# Patient Record
Sex: Female | Born: 1965 | Race: White | Hispanic: No | Smoking: Current every day smoker
Health system: Southern US, Community
[De-identification: ages and names within clinical notes are randomized; demographics above are authoritative.]

## PROBLEM LIST (undated history)

## (undated) DIAGNOSIS — Z860100 Personal history of colon polyps, unspecified: Secondary | ICD-10-CM

## (undated) DIAGNOSIS — Z8371 Family history of colonic polyps: Secondary | ICD-10-CM

## (undated) DIAGNOSIS — Z8489 Family history of other specified conditions: Secondary | ICD-10-CM

## (undated) DIAGNOSIS — G4730 Sleep apnea, unspecified: Secondary | ICD-10-CM

## (undated) DIAGNOSIS — G2581 Restless legs syndrome: Secondary | ICD-10-CM

## (undated) DIAGNOSIS — R112 Nausea with vomiting, unspecified: Secondary | ICD-10-CM

## (undated) DIAGNOSIS — K219 Gastro-esophageal reflux disease without esophagitis: Secondary | ICD-10-CM

## (undated) DIAGNOSIS — M1990 Unspecified osteoarthritis, unspecified site: Secondary | ICD-10-CM

## (undated) DIAGNOSIS — Z9889 Other specified postprocedural states: Secondary | ICD-10-CM

## (undated) DIAGNOSIS — I10 Essential (primary) hypertension: Secondary | ICD-10-CM

## (undated) DIAGNOSIS — Z803 Family history of malignant neoplasm of breast: Secondary | ICD-10-CM

## (undated) DIAGNOSIS — Z85528 Personal history of other malignant neoplasm of kidney: Secondary | ICD-10-CM

## (undated) DIAGNOSIS — M109 Gout, unspecified: Secondary | ICD-10-CM

## (undated) DIAGNOSIS — E119 Type 2 diabetes mellitus without complications: Secondary | ICD-10-CM

## (undated) DIAGNOSIS — Q859 Phakomatosis, unspecified: Secondary | ICD-10-CM

## (undated) DIAGNOSIS — C649 Malignant neoplasm of unspecified kidney, except renal pelvis: Secondary | ICD-10-CM

## (undated) DIAGNOSIS — Z801 Family history of malignant neoplasm of trachea, bronchus and lung: Secondary | ICD-10-CM

## (undated) DIAGNOSIS — Z800 Family history of malignant neoplasm of digestive organs: Secondary | ICD-10-CM

## (undated) DIAGNOSIS — M5136 Other intervertebral disc degeneration, lumbar region: Secondary | ICD-10-CM

## (undated) DIAGNOSIS — Z83719 Family history of colon polyps, unspecified: Secondary | ICD-10-CM

## (undated) DIAGNOSIS — M51369 Other intervertebral disc degeneration, lumbar region without mention of lumbar back pain or lower extremity pain: Secondary | ICD-10-CM

## (undated) DIAGNOSIS — E785 Hyperlipidemia, unspecified: Secondary | ICD-10-CM

## (undated) HISTORY — DX: Gout, unspecified: M10.9

## (undated) HISTORY — DX: Personal history of colonic polyps: Z86.010

## (undated) HISTORY — DX: Unspecified osteoarthritis, unspecified site: M19.90

## (undated) HISTORY — DX: Gastro-esophageal reflux disease without esophagitis: K21.9

## (undated) HISTORY — DX: Family history of malignant neoplasm of digestive organs: Z80.0

## (undated) HISTORY — DX: Personal history of colon polyps, unspecified: Z86.0100

## (undated) HISTORY — PX: TUBAL LIGATION: SHX77

## (undated) HISTORY — PX: ABDOMINAL HYSTERECTOMY: SHX81

## (undated) HISTORY — DX: Family history of malignant neoplasm of breast: Z80.3

## (undated) HISTORY — DX: Family history of colonic polyps: Z83.71

## (undated) HISTORY — DX: Family history of malignant neoplasm of trachea, bronchus and lung: Z80.1

## (undated) HISTORY — PX: CHOLECYSTECTOMY: SHX55

## (undated) HISTORY — PX: OTHER SURGICAL HISTORY: SHX169

## (undated) HISTORY — DX: Sleep apnea, unspecified: G47.30

## (undated) HISTORY — DX: Phakomatosis, unspecified: Q85.9

## (undated) HISTORY — DX: Personal history of other malignant neoplasm of kidney: Z85.528

## (undated) HISTORY — PX: TONSILLECTOMY: SUR1361

## (undated) HISTORY — DX: Family history of colon polyps, unspecified: Z83.719

---

## 2004-11-17 ENCOUNTER — Emergency Department: Payer: Self-pay | Admitting: Emergency Medicine

## 2005-05-05 ENCOUNTER — Ambulatory Visit: Payer: Self-pay | Admitting: Obstetrics and Gynecology

## 2005-08-14 ENCOUNTER — Ambulatory Visit: Payer: Self-pay | Admitting: Obstetrics and Gynecology

## 2005-09-22 ENCOUNTER — Ambulatory Visit: Payer: Self-pay | Admitting: Internal Medicine

## 2006-06-05 DIAGNOSIS — C649 Malignant neoplasm of unspecified kidney, except renal pelvis: Secondary | ICD-10-CM

## 2006-06-05 HISTORY — DX: Malignant neoplasm of unspecified kidney, except renal pelvis: C64.9

## 2006-06-05 HISTORY — PX: ROBOTIC ASSITED PARTIAL NEPHRECTOMY: SHX6087

## 2006-08-28 ENCOUNTER — Ambulatory Visit: Payer: Self-pay | Admitting: Obstetrics and Gynecology

## 2006-08-29 ENCOUNTER — Ambulatory Visit: Payer: Self-pay | Admitting: Obstetrics and Gynecology

## 2006-08-29 ENCOUNTER — Other Ambulatory Visit: Payer: Self-pay

## 2006-09-03 ENCOUNTER — Ambulatory Visit: Payer: Self-pay | Admitting: Obstetrics and Gynecology

## 2007-04-01 ENCOUNTER — Ambulatory Visit: Payer: Self-pay | Admitting: Internal Medicine

## 2007-04-05 ENCOUNTER — Ambulatory Visit: Payer: Self-pay | Admitting: Internal Medicine

## 2007-04-06 ENCOUNTER — Ambulatory Visit: Payer: Self-pay | Admitting: Internal Medicine

## 2007-04-12 ENCOUNTER — Ambulatory Visit: Payer: Self-pay | Admitting: Internal Medicine

## 2007-05-06 ENCOUNTER — Ambulatory Visit: Payer: Self-pay | Admitting: Internal Medicine

## 2007-07-07 ENCOUNTER — Ambulatory Visit: Payer: Self-pay | Admitting: Internal Medicine

## 2007-08-04 ENCOUNTER — Ambulatory Visit: Payer: Self-pay | Admitting: Internal Medicine

## 2007-08-30 ENCOUNTER — Ambulatory Visit: Payer: Self-pay | Admitting: Obstetrics and Gynecology

## 2007-09-04 ENCOUNTER — Ambulatory Visit: Payer: Self-pay | Admitting: Internal Medicine

## 2007-12-04 ENCOUNTER — Ambulatory Visit: Payer: Self-pay | Admitting: Internal Medicine

## 2007-12-20 ENCOUNTER — Ambulatory Visit: Payer: Self-pay | Admitting: Internal Medicine

## 2008-01-04 ENCOUNTER — Ambulatory Visit: Payer: Self-pay | Admitting: Internal Medicine

## 2008-02-04 ENCOUNTER — Ambulatory Visit: Payer: Self-pay | Admitting: Internal Medicine

## 2008-03-05 ENCOUNTER — Ambulatory Visit: Payer: Self-pay | Admitting: Internal Medicine

## 2008-03-30 IMAGING — CT CT ABDOMEN W/O CM
1 of 2 series · 15 of 32 positions shown, 20 images · non-contrast
Comparison: none

REASON FOR EXAM: renal cancer renal insufficiency
COMMENTS:

PROCEDURE:     CT  - CT ABDOMEN STANDARD WO  - July 31, 2007  [DATE]
RESULT:
HISTORY: Renal insufficiency, renal cancer.

[Series 2: abdomen · axial · 0.69mm/px · z∈[-818,-554]mm · 15 of 37 slices shown, 20 images]
[im 2/37  soft-tissue]
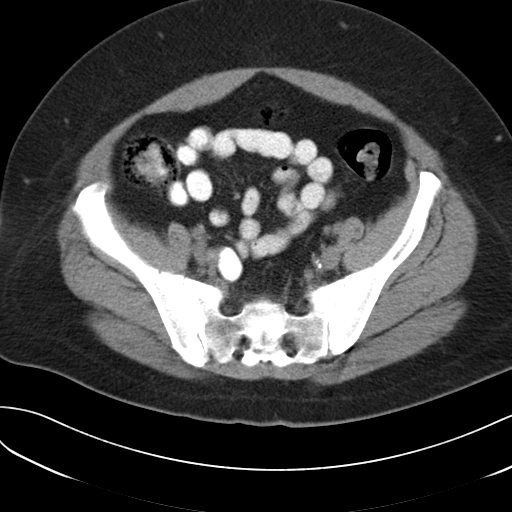
[im 2/37  bone]
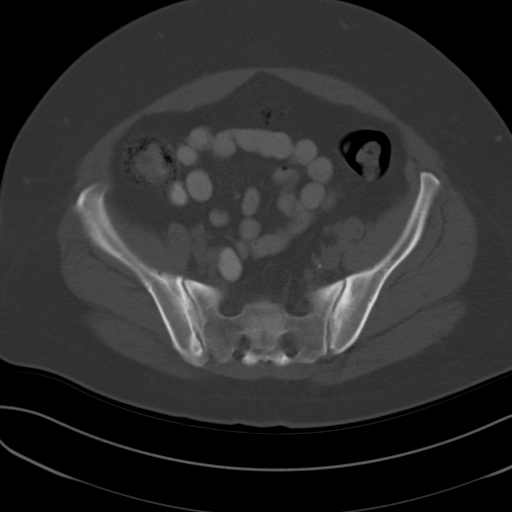
[im 5/37  soft-tissue]
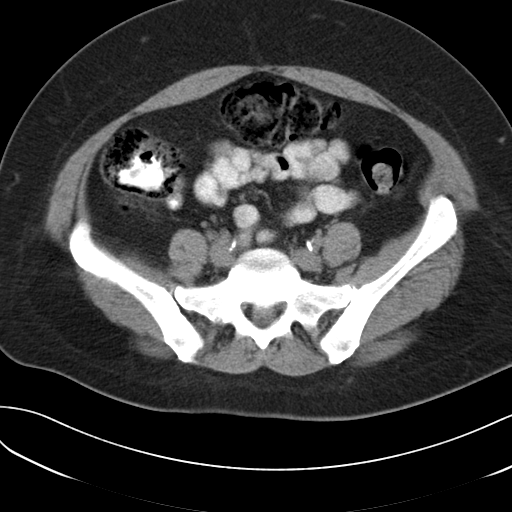
[im 7/37  soft-tissue]
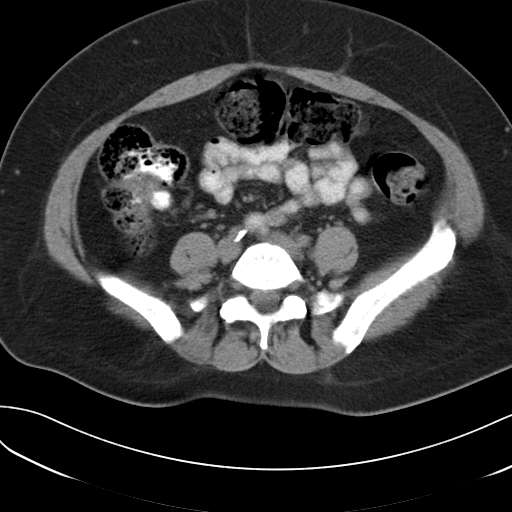
[im 10/37  soft-tissue]
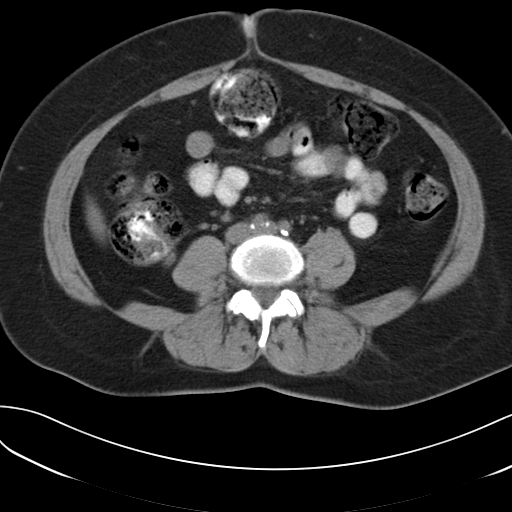
[im 12/37  soft-tissue]
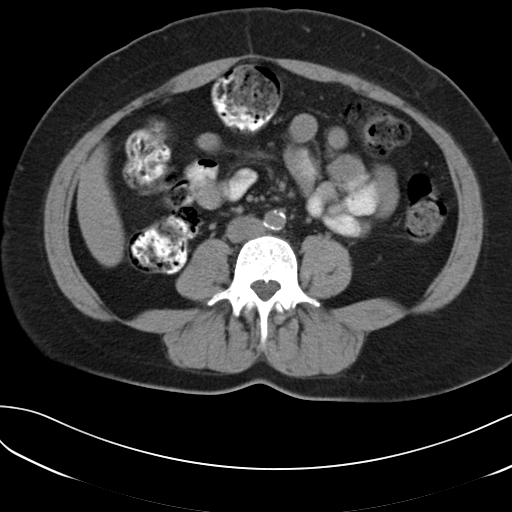
[im 15/37  soft-tissue]
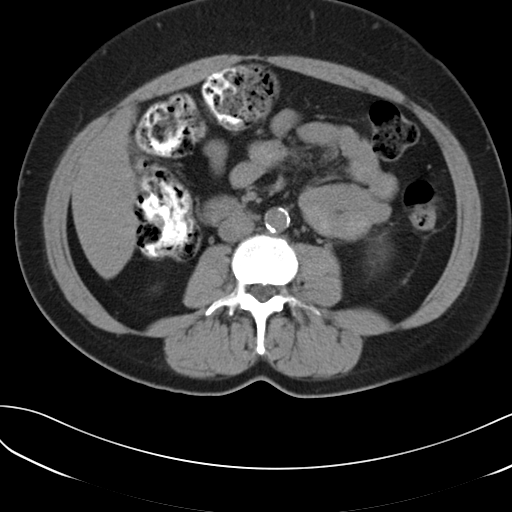
[im 17/37  soft-tissue]
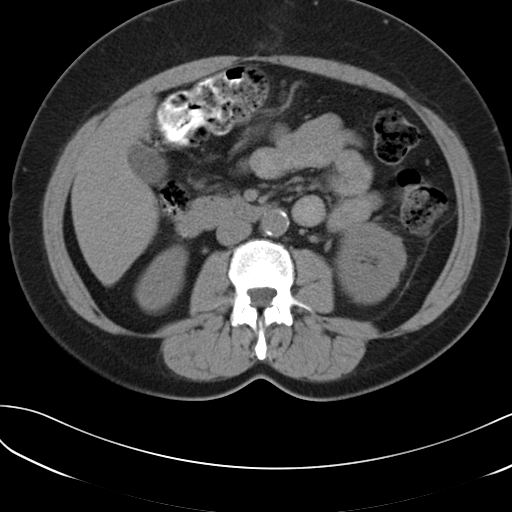
[im 20/37  soft-tissue]
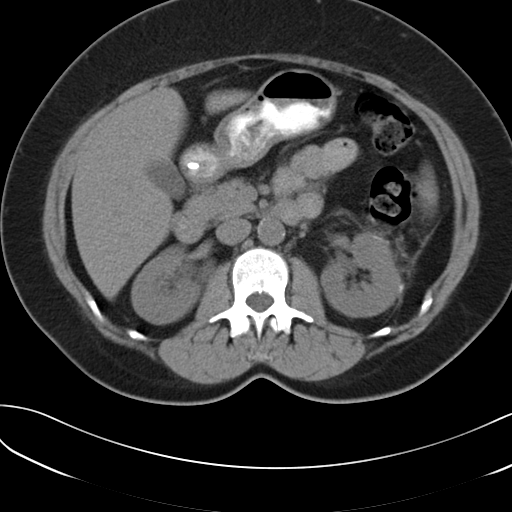
[im 22/37  soft-tissue]
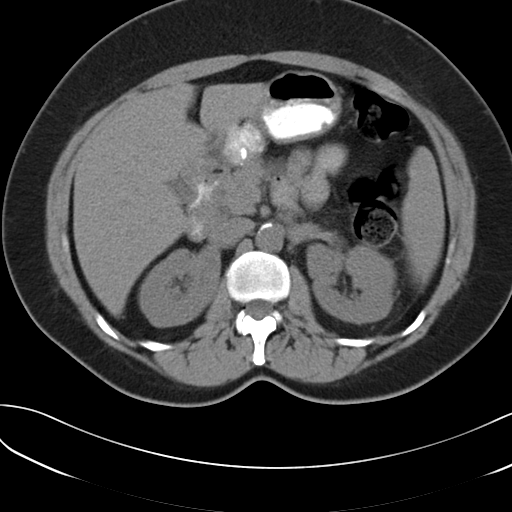
[im 22/37  bone]
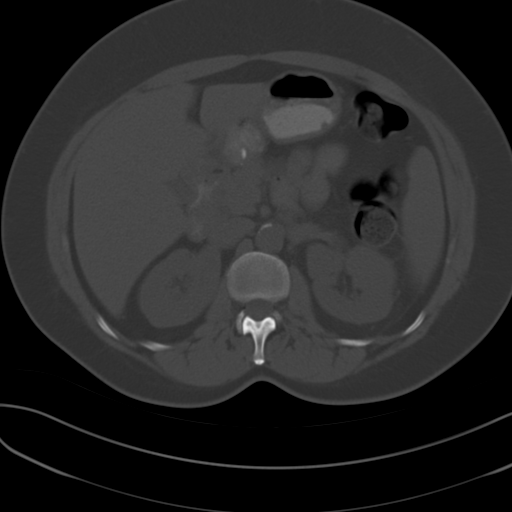
[im 25/37  soft-tissue]
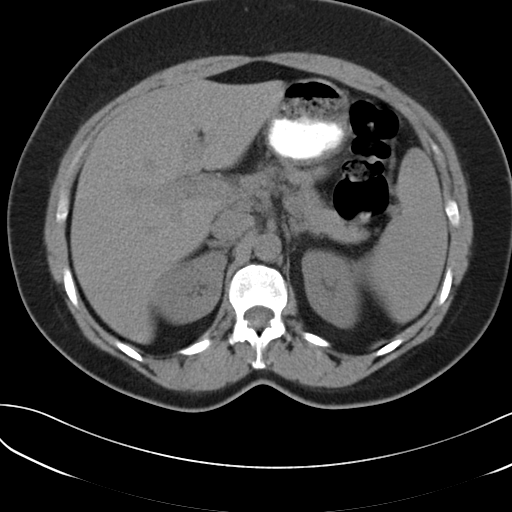
[im 27/37  soft-tissue]
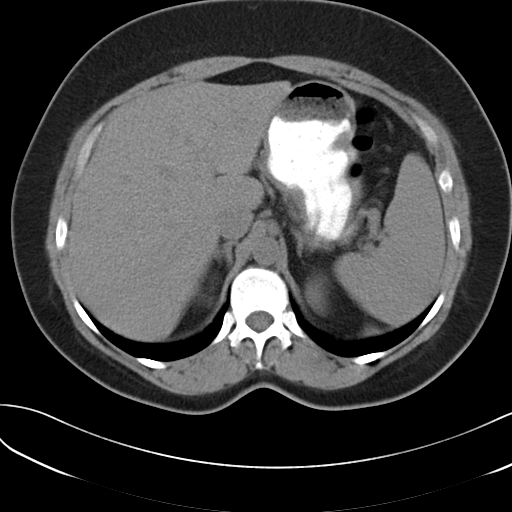
[im 30/37  soft-tissue]
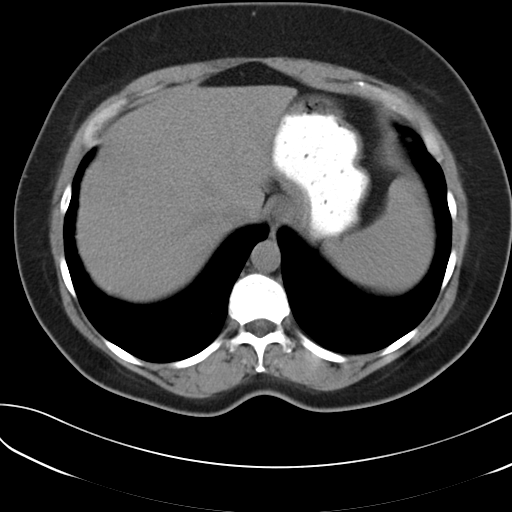
[im 30/37  lung]
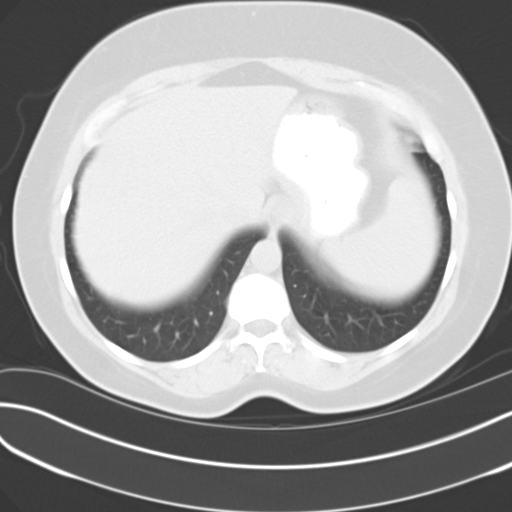
[im 32/37  soft-tissue]
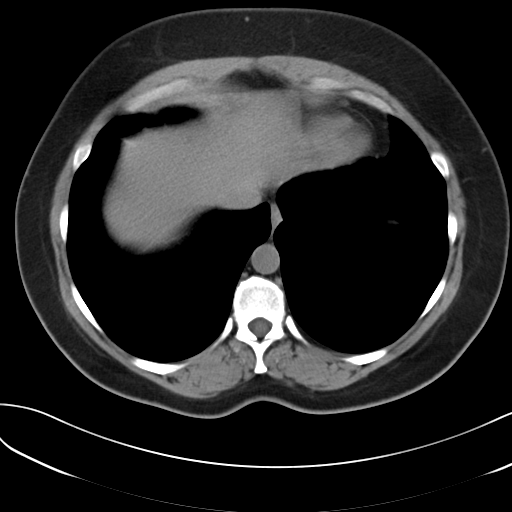
[im 32/37  lung]
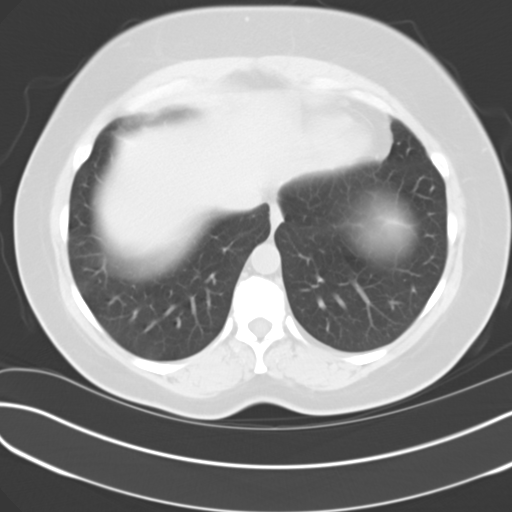
[im 33/37  lung]
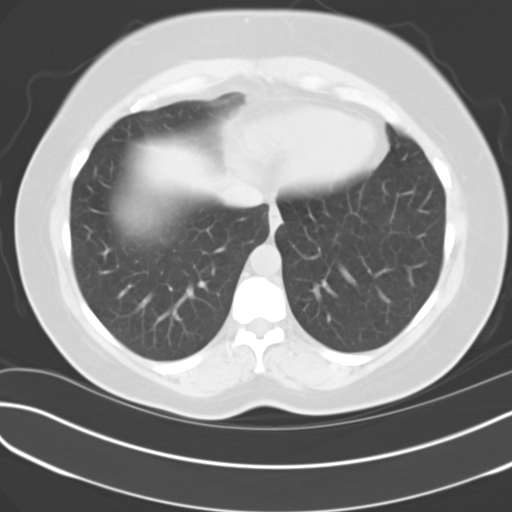
[im 35/37  soft-tissue]
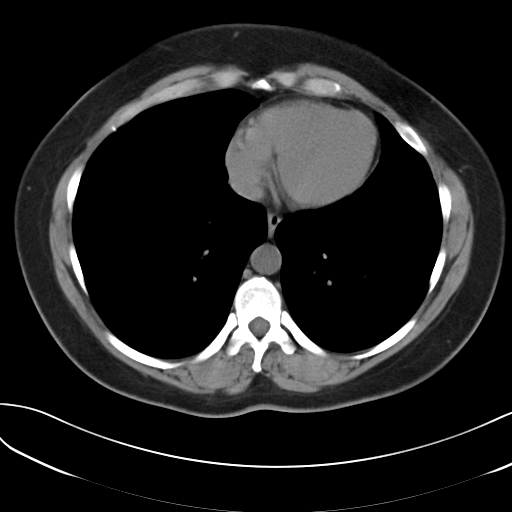
[im 35/37  lung]
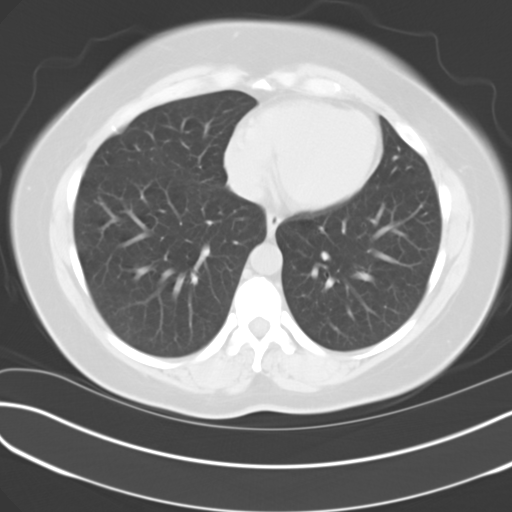

[15 of 32 positions shown; findings below may reference images not displayed]

FINDINGS: The liver and spleen are normal on this noncontrast enhanced
scan.  The pancreas is normal.  The gallbladder is normal.  No adrenal
abnormalities are identified. Previously identified LEFT renal lesion is no
longer identified on this noncontrast exam.  By history, the patient has had
a partial nephrectomy.  No evidence of venous distention. No bowel
distention.  The RIGHT lower quadrant is unremarkable.
IMPRESSION: The patient has had a prior partial nephrectomy.  Previously identified LEFT
renal mass is no longer visualized.

## 2008-05-06 ENCOUNTER — Ambulatory Visit: Payer: Self-pay | Admitting: Gastroenterology

## 2008-06-05 ENCOUNTER — Ambulatory Visit: Payer: Self-pay | Admitting: Internal Medicine

## 2008-06-30 ENCOUNTER — Ambulatory Visit: Payer: Self-pay | Admitting: Internal Medicine

## 2008-07-06 ENCOUNTER — Ambulatory Visit: Payer: Self-pay | Admitting: Internal Medicine

## 2008-08-03 ENCOUNTER — Ambulatory Visit: Payer: Self-pay | Admitting: Internal Medicine

## 2008-08-31 ENCOUNTER — Ambulatory Visit: Payer: Self-pay | Admitting: Obstetrics and Gynecology

## 2009-01-03 ENCOUNTER — Ambulatory Visit: Payer: Self-pay | Admitting: Internal Medicine

## 2009-01-05 ENCOUNTER — Ambulatory Visit: Payer: Self-pay | Admitting: Internal Medicine

## 2009-02-03 ENCOUNTER — Ambulatory Visit: Payer: Self-pay | Admitting: Internal Medicine

## 2009-06-05 ENCOUNTER — Ambulatory Visit: Payer: Self-pay | Admitting: Internal Medicine

## 2009-06-24 ENCOUNTER — Ambulatory Visit: Payer: Self-pay | Admitting: Internal Medicine

## 2009-07-02 ENCOUNTER — Ambulatory Visit: Payer: Self-pay | Admitting: Internal Medicine

## 2009-07-06 ENCOUNTER — Ambulatory Visit: Payer: Self-pay | Admitting: Internal Medicine

## 2009-07-23 ENCOUNTER — Ambulatory Visit: Payer: Self-pay | Admitting: Orthopedic Surgery

## 2009-07-29 ENCOUNTER — Ambulatory Visit: Payer: Self-pay | Admitting: Orthopedic Surgery

## 2009-09-07 ENCOUNTER — Ambulatory Visit: Payer: Self-pay | Admitting: Obstetrics and Gynecology

## 2010-01-20 ENCOUNTER — Ambulatory Visit: Payer: Self-pay | Admitting: Internal Medicine

## 2010-02-03 ENCOUNTER — Ambulatory Visit: Payer: Self-pay | Admitting: Internal Medicine

## 2010-07-11 ENCOUNTER — Ambulatory Visit: Payer: Self-pay | Admitting: Internal Medicine

## 2010-07-12 LAB — CA 125: CA 125: 13 U/mL (ref 0.0–34.0)

## 2010-08-04 ENCOUNTER — Ambulatory Visit: Payer: Self-pay | Admitting: Internal Medicine

## 2010-09-27 ENCOUNTER — Ambulatory Visit: Payer: Self-pay | Admitting: Obstetrics and Gynecology

## 2011-01-23 ENCOUNTER — Ambulatory Visit: Payer: Self-pay | Admitting: Internal Medicine

## 2011-02-04 ENCOUNTER — Ambulatory Visit: Payer: Self-pay | Admitting: Internal Medicine

## 2011-07-27 ENCOUNTER — Ambulatory Visit: Payer: Self-pay | Admitting: Internal Medicine

## 2011-07-28 LAB — CA 125: CA 125: 13.6 U/mL (ref 0.0–34.0)

## 2011-08-04 ENCOUNTER — Ambulatory Visit: Payer: Self-pay | Admitting: Internal Medicine

## 2011-09-28 ENCOUNTER — Ambulatory Visit: Payer: Self-pay | Admitting: Obstetrics and Gynecology

## 2012-05-13 ENCOUNTER — Ambulatory Visit: Payer: Self-pay | Admitting: Internal Medicine

## 2014-06-10 ENCOUNTER — Emergency Department: Payer: Self-pay | Admitting: Emergency Medicine

## 2014-06-10 LAB — COMPREHENSIVE METABOLIC PANEL
ALT: 76 U/L — AB
Albumin: 3.6 g/dL (ref 3.4–5.0)
Alkaline Phosphatase: 57 U/L
Anion Gap: 6 — ABNORMAL LOW (ref 7–16)
BILIRUBIN TOTAL: 0.3 mg/dL (ref 0.2–1.0)
BUN: 9 mg/dL (ref 7–18)
CO2: 29 mmol/L (ref 21–32)
CREATININE: 0.76 mg/dL (ref 0.60–1.30)
Calcium, Total: 8.5 mg/dL (ref 8.5–10.1)
Chloride: 104 mmol/L (ref 98–107)
EGFR (African American): >60
EGFR (Non-African Amer.): >60
Glucose: 96 mg/dL (ref 65–99)
Osmolality: 276 (ref 275–301)
Potassium: 3.6 mmol/L (ref 3.5–5.1)
SGOT(AST): 62 U/L — ABNORMAL HIGH (ref 15–37)
Sodium: 139 mmol/L (ref 136–145)
Total Protein: 7.5 g/dL (ref 6.4–8.2)

## 2014-06-10 LAB — CBC WITH DIFFERENTIAL/PLATELET
BASOS ABS: 0.1 10*3/uL (ref 0.0–0.1)
Basophil %: 1 %
EOS ABS: 0.2 10*3/uL (ref 0.0–0.7)
Eosinophil %: 2.2 %
HCT: 44.6 % (ref 35.0–47.0)
HGB: 14.3 g/dL (ref 12.0–16.0)
LYMPHS ABS: 2.9 10*3/uL (ref 1.0–3.6)
Lymphocyte %: 33.7 %
MCH: 29 pg (ref 26.0–34.0)
MCHC: 32.2 g/dL (ref 32.0–36.0)
MCV: 90 fL (ref 80–100)
MONOS PCT: 12.3 %
Monocyte #: 1.1 x10 3/mm — ABNORMAL HIGH (ref 0.2–0.9)
Neutrophil #: 4.4 10*3/uL (ref 1.4–6.5)
Neutrophil %: 50.8 %
PLATELETS: 204 10*3/uL (ref 150–440)
RBC: 4.95 10*6/uL (ref 3.80–5.20)
RDW: 13.9 % (ref 11.5–14.5)
WBC: 8.7 10*3/uL (ref 3.6–11.0)

## 2014-06-10 LAB — URINALYSIS, COMPLETE
BILIRUBIN, UR: NEGATIVE
BLOOD: NEGATIVE
Glucose,UR: NEGATIVE mg/dL (ref 0–75)
KETONE: NEGATIVE
Leukocyte Esterase: NEGATIVE
Nitrite: NEGATIVE
PROTEIN: NEGATIVE
Ph: 7 (ref 4.5–8.0)
RBC,UR: NONE SEEN /HPF (ref 0–5)
SPECIFIC GRAVITY: 1.009 (ref 1.003–1.030)
Squamous Epithelial: 2

## 2014-06-10 LAB — LIPASE, BLOOD: Lipase: 105 U/L (ref 73–393)

## 2014-06-10 LAB — TROPONIN I: Troponin-I: <0.02 ng/mL

## 2014-06-23 ENCOUNTER — Ambulatory Visit: Payer: Self-pay | Admitting: Surgery

## 2014-06-23 LAB — BASIC METABOLIC PANEL
Anion Gap: 4 — ABNORMAL LOW (ref 7–16)
BUN: 6 mg/dL — AB (ref 7–18)
CREATININE: 0.78 mg/dL (ref 0.60–1.30)
Calcium, Total: 8.9 mg/dL (ref 8.5–10.1)
Chloride: 106 mmol/L (ref 98–107)
Co2: 32 mmol/L (ref 21–32)
EGFR (African American): >60
EGFR (Non-African Amer.): >60
GLUCOSE: 76 mg/dL (ref 65–99)
Osmolality: 279 (ref 275–301)
POTASSIUM: 4.1 mmol/L (ref 3.5–5.1)
SODIUM: 142 mmol/L (ref 136–145)

## 2014-06-23 LAB — CBC WITH DIFFERENTIAL/PLATELET
Basophil #: 0.1 10*3/uL (ref 0.0–0.1)
Basophil %: 1.1 %
EOS ABS: 0.3 10*3/uL (ref 0.0–0.7)
Eosinophil %: 3.2 %
HCT: 43.9 % (ref 35.0–47.0)
HGB: 14.4 g/dL (ref 12.0–16.0)
LYMPHS ABS: 3 10*3/uL (ref 1.0–3.6)
Lymphocyte %: 34.1 %
MCH: 29.5 pg (ref 26.0–34.0)
MCHC: 32.7 g/dL (ref 32.0–36.0)
MCV: 90 fL (ref 80–100)
MONO ABS: 1 x10 3/mm — AB (ref 0.2–0.9)
Monocyte %: 11.4 %
Neutrophil #: 4.4 10*3/uL (ref 1.4–6.5)
Neutrophil %: 50.2 %
Platelet: 226 10*3/uL (ref 150–440)
RBC: 4.87 10*6/uL (ref 3.80–5.20)
RDW: 13.6 % (ref 11.5–14.5)
WBC: 8.8 10*3/uL (ref 3.6–11.0)

## 2014-06-23 LAB — HEPATIC FUNCTION PANEL A (ARMC)
Albumin: 3.7 g/dL (ref 3.4–5.0)
Alkaline Phosphatase: 57 U/L
BILIRUBIN TOTAL: 0.3 mg/dL (ref 0.2–1.0)
Bilirubin, Direct: <0.1 mg/dL (ref 0.0–0.2)
SGOT(AST): 53 U/L — ABNORMAL HIGH (ref 15–37)
SGPT (ALT): 73 U/L — ABNORMAL HIGH
Total Protein: 7.4 g/dL (ref 6.4–8.2)

## 2014-06-23 LAB — LIPASE, BLOOD: Lipase: 117 U/L (ref 73–393)

## 2014-07-01 ENCOUNTER — Ambulatory Visit: Payer: Self-pay | Admitting: Surgery

## 2014-08-21 ENCOUNTER — Emergency Department: Payer: Self-pay | Admitting: Emergency Medicine

## 2014-09-28 LAB — SURGICAL PATHOLOGY

## 2014-10-04 NOTE — H&P (Signed)
PATIENT NAME:  Martha Hill, Martha Hill MR#:  [id number] DATE OF BIRTH:  07/11/1965  DATE OF ADMISSION:  07/01/2014  CHIEF COMPLAINT: Right upper quadrant pain.   HISTORY OF PRESENT ILLNESS: This is a patient with recurrent episodic right upper quadrant pain and a work-up showing gallstones.  She is here for elective laparoscopic cholecystectomy.   PAST MEDICAL HISTORY: Reflux disease, kidney disease, and hypertension.   PAST SURGICAL HISTORY: Hysterectomy, kidney cancer and ear surgery.   MEDICATIONS: Lisinopril, Lasix and potassium.   ALLERGIES: SULFA.   FAMILY HISTORY: Noncontributory.   SOCIAL HISTORY: The patient is currently a smoker. Does not drink alcohol.    REVIEW OF SYSTEMS:  A complete system review was performed and negative with the exception of that mentioned in the history of present illness.   PHYSICAL EXAMINATION:  GENERAL: Healthy, comfortable -appearing female patient. She is obese and smells of smoke.  HEENT: No scleral icterus.  NECK: No palpable neck nodes.  CHEST: Clear to auscultation.  CARDIAC: Regular rate and rhythm.  ABDOMEN: Soft, nontender.  EXTREMITIES: Without edema.  NEUROLOGIC: Grossly intact.  INTEGUMENT: No jaundice.   LABORATORY VALUES: Show a white blood cell count 8.8, an hemoglobin and hematocrit of 14 and 44, platelet count of 226,000.  Liver function test, AST and ALT of 53 and 73, respectively with a bilirubin of 0.3.   ASSESSMENT AND PLAN: This is a patient with symptomatic cholelithiasis, slightly elevated liver function tests have recommended laparoscopic cholecystectomy with cholangiography. The rationale for this has been discussed. The options of observation have been reviewed. The risks of bleeding, infection, recurrence, failure to resolve her symptoms, conversion to an open procedure, bile duct damage, bile duct leak, retained common bile duct stone, any of which could require further surgery and/or ERCP, stent, and papillotomy, have all  been discussed.  She understood and agreed to proceed.      ____________________________ Jerrol Banana Burt Knack, MD rec:DT D: 06/29/2014 13:13:12 ET T: 06/29/2014 13:33:39 ET JOB#: [id number]  cc: Jerrol Banana. Burt Knack, MD, <Dictator> Florene Glen MD ELECTRONICALLY SIGNED 06/29/2014 18:59

## 2014-10-04 NOTE — Op Note (Signed)
PATIENT NAME:  Martha Hill, KOCH MR#:  [id number] DATE OF BIRTH:  03-Jun-1966  DATE OF PROCEDURE:  07/01/2014  PREOPERATIVE DIAGNOSIS: Symptomatic cholelithiasis with elevated liver function tests.  POSTOPERATIVE DIAGNOSIS: Symptomatic cholelithiasis with elevated liver function tests.    PROCEDURE: Laparoscopic cholecystectomy with C-arm fluoroscopic cholangiography.   SURGEON: Oaklie Durrett E. Burt Knack, MD    ANESTHESIA: General with endotracheal tube.   ASSISTANT: Lawernce Keas, PA-S   INDICATIONS: This is a patient with recurrent epigastric and right upper quadrant pain associated with fatty food intolerance and workup showing gallstones with mildly elevated liver function tests.   Preoperatively, we discussed rationale for surgery, the options of observation, the risk of bleeding, infection, recurrence of symptoms, failure to resolve all of her symptoms, open procedure, bile duct damage, bile duct leak, retained common bile duct stone, any of which could require further surgery and/or ERCP, stent, and papillotomy. This was all reviewed for her and the need for a cholangiogram was discussed as well.   FINDINGS: Mild acute inflammation with some scar in the area of the gallbladder. Also, the cystic duct was very small, but cholangiography was performed. The cystic duct was cannulated. Proximal ducts and distal ducts were normal to slightly dilated. No intraluminal filling defects. Good flow into the duodenum. The pancreatic duct was identified as well.   DESCRIPTION OF PROCEDURE: The patient was induced to general anesthesia, given IV antibiotics. VTE prophylaxis was in place. She was prepped and draped in a sterile fashion. Marcaine was infiltrated in the skin and subcutaneous tissues around the supraumbilical area. An incision was made. Veress needle was placed. Pneumoperitoneum was obtained. A 5 mm trocar port was placed. The abdominal cavity was explored, and under direct vision a 10 mm epigastric  port and 2 lateral 5 mm ports were placed. The camera was placed in the epigastric site to view back the periumbilical site. There was no sign of adhesions or bowel injury. The camera was placed in the periumbilical site again and the gallbladder was placed on tension. Peritoneum over the infundibulum was incised bluntly after taking down some scar tissue bluntly without the use of energy. The cystic duct gallbladder junction was well identified, clipped, and incised and through a separate incision, an Angiocath cholangiogram catheter was placed. C-arm fluoroscopic cholangiography demonstrated the above. The catheter was removed. The cystic duct was doubly clipped and divided. The cystic artery was doubly clipped and divided and the gallbladder sacrum and the gallbladder fossa with electrocautery passed out through the epigastric port site with the aid of an Endo Catch bag. The area was checked for hemostasis and found to be adequate. There was no sign of bleeding, bile leak or bowel injury. The camera was again placed in the epigastric site to view back the periumbilical site. No sign of bowel injury. Therefore, pneumoperitoneum was released. All ports were removed. An attempt at closure of the fascial edges at the epigastric site was performed, made difficult due to her obesity. This was done with figure-of-eight 0 Vicryls and then 4-0 subcuticular Monocryl was used on all skin edges. Steri-Strips, Mastisol, and sterile dressings were placed.   The patient tolerated the procedure well. There were no complications. She was taken to the recovery room in stable condition to be discharged in the care of the family. Follow up in 10 days.     ____________________________ Jerrol Banana Burt Knack, MD rec:at D: 07/01/2014 08:08:35 ET T: 07/01/2014 10:43:34 ET JOB#: [id number]  cc: Jerrol Banana. Burt Knack, MD, <Dictator> Delfino Lovett E  Demian Maisel MD ELECTRONICALLY SIGNED 07/02/2014 9:07

## 2015-01-08 ENCOUNTER — Other Ambulatory Visit: Payer: Self-pay | Admitting: Internal Medicine

## 2015-01-08 DIAGNOSIS — Z1231 Encounter for screening mammogram for malignant neoplasm of breast: Secondary | ICD-10-CM

## 2015-01-08 DIAGNOSIS — Z85528 Personal history of other malignant neoplasm of kidney: Secondary | ICD-10-CM

## 2015-01-13 ENCOUNTER — Ambulatory Visit: Payer: Medicaid Other

## 2015-01-13 ENCOUNTER — Ambulatory Visit
Admission: RE | Admit: 2015-01-13 | Discharge: 2015-01-13 | Disposition: A | Discharge status: Home / Self Care | Payer: Medicaid Other | Source: Ambulatory Visit | Attending: Internal Medicine | Admitting: Internal Medicine

## 2015-01-13 ENCOUNTER — Telehealth (HOSPITAL_COMMUNITY): Payer: Self-pay | Admitting: *Deleted

## 2015-01-13 DIAGNOSIS — Z1231 Encounter for screening mammogram for malignant neoplasm of breast: Secondary | ICD-10-CM | POA: Diagnosis not present

## 2015-01-14 ENCOUNTER — Ambulatory Visit: Payer: Self-pay

## 2015-01-15 ENCOUNTER — Ambulatory Visit: Payer: Self-pay

## 2015-01-25 ENCOUNTER — Ambulatory Visit: Payer: Medicaid Other

## 2015-02-02 ENCOUNTER — Ambulatory Visit: Payer: Medicaid Other | Attending: Specialist

## 2015-02-02 DIAGNOSIS — I10 Essential (primary) hypertension: Secondary | ICD-10-CM | POA: Diagnosis present

## 2015-02-02 DIAGNOSIS — R0683 Snoring: Secondary | ICD-10-CM | POA: Insufficient documentation

## 2015-02-02 DIAGNOSIS — G4761 Periodic limb movement disorder: Secondary | ICD-10-CM | POA: Insufficient documentation

## 2015-02-02 DIAGNOSIS — E669 Obesity, unspecified: Secondary | ICD-10-CM | POA: Diagnosis not present

## 2015-02-02 DIAGNOSIS — G2581 Restless legs syndrome: Secondary | ICD-10-CM | POA: Insufficient documentation

## 2015-02-02 DIAGNOSIS — G4733 Obstructive sleep apnea (adult) (pediatric): Secondary | ICD-10-CM | POA: Diagnosis not present

## 2015-02-02 DIAGNOSIS — G478 Other sleep disorders: Secondary | ICD-10-CM | POA: Diagnosis present

## 2015-02-02 DIAGNOSIS — G4730 Sleep apnea, unspecified: Secondary | ICD-10-CM | POA: Diagnosis present

## 2015-06-09 ENCOUNTER — Other Ambulatory Visit: Payer: Self-pay | Admitting: Internal Medicine

## 2015-06-09 DIAGNOSIS — M5116 Intervertebral disc disorders with radiculopathy, lumbar region: Secondary | ICD-10-CM

## 2015-06-25 ENCOUNTER — Ambulatory Visit
Admission: RE | Admit: 2015-06-25 | Discharge: 2015-06-25 | Disposition: A | Discharge status: Home / Self Care | Payer: Medicaid Other | Source: Ambulatory Visit | Attending: Internal Medicine | Admitting: Internal Medicine

## 2015-06-25 DIAGNOSIS — M5116 Intervertebral disc disorders with radiculopathy, lumbar region: Secondary | ICD-10-CM | POA: Diagnosis not present

## 2017-03-29 ENCOUNTER — Emergency Department
Admission: EM | Admit: 2017-03-29 | Discharge: 2017-03-29 | Disposition: A | Discharge status: Home / Self Care | Payer: Self-pay | Attending: Emergency Medicine | Admitting: Emergency Medicine

## 2017-03-29 DIAGNOSIS — E119 Type 2 diabetes mellitus without complications: Secondary | ICD-10-CM | POA: Insufficient documentation

## 2017-03-29 DIAGNOSIS — F17210 Nicotine dependence, cigarettes, uncomplicated: Secondary | ICD-10-CM | POA: Insufficient documentation

## 2017-03-29 DIAGNOSIS — N3001 Acute cystitis with hematuria: Secondary | ICD-10-CM | POA: Insufficient documentation

## 2017-03-29 DIAGNOSIS — Z85528 Personal history of other malignant neoplasm of kidney: Secondary | ICD-10-CM | POA: Insufficient documentation

## 2017-03-29 DIAGNOSIS — I10 Essential (primary) hypertension: Secondary | ICD-10-CM | POA: Insufficient documentation

## 2017-03-29 DIAGNOSIS — Z9049 Acquired absence of other specified parts of digestive tract: Secondary | ICD-10-CM | POA: Insufficient documentation

## 2017-03-29 HISTORY — DX: Essential (primary) hypertension: I10

## 2017-03-29 HISTORY — DX: Type 2 diabetes mellitus without complications: E11.9

## 2017-03-29 HISTORY — DX: Malignant neoplasm of unspecified kidney, except renal pelvis: C64.9

## 2017-03-29 LAB — URINALYSIS, COMPLETE (UACMP) WITH MICROSCOPIC
Bilirubin Urine: NEGATIVE
Glucose, UA: NEGATIVE mg/dL
KETONES UR: NEGATIVE mg/dL
Nitrite: POSITIVE — AB
PROTEIN: 30 mg/dL — AB
Specific Gravity, Urine: 1.015 (ref 1.005–1.030)
pH: 7 (ref 5.0–8.0)

## 2017-03-29 LAB — CBC
HCT: 42.2 % (ref 35.0–47.0)
HEMOGLOBIN: 13.9 g/dL (ref 12.0–16.0)
MCH: 29 pg (ref 26.0–34.0)
MCHC: 33 g/dL (ref 32.0–36.0)
MCV: 87.8 fL (ref 80.0–100.0)
Platelets: 195 10*3/uL (ref 150–440)
RBC: 4.8 MIL/uL (ref 3.80–5.20)
RDW: 13.9 % (ref 11.5–14.5)
WBC: 8.4 10*3/uL (ref 3.6–11.0)

## 2017-03-29 LAB — BASIC METABOLIC PANEL
Anion gap: 11 (ref 5–15)
BUN: 10 mg/dL (ref 6–20)
CHLORIDE: 98 mmol/L — AB (ref 101–111)
CO2: 29 mmol/L (ref 22–32)
CREATININE: 0.83 mg/dL (ref 0.44–1.00)
Calcium: 9 mg/dL (ref 8.9–10.3)
GFR calc Af Amer: >60 mL/min (ref >60)
GFR calc non Af Amer: >60 mL/min (ref >60)
Glucose, Bld: 183 mg/dL — ABNORMAL HIGH (ref 65–99)
Potassium: 3.9 mmol/L (ref 3.5–5.1)
SODIUM: 138 mmol/L (ref 135–145)

## 2017-03-29 MED ORDER — CEPHALEXIN 500 MG PO CAPS
500.0000 mg | ORAL_CAPSULE | Freq: Once | ORAL | Status: AC
  Administered 2017-03-29: 500 mg via ORAL
  Filled 2017-03-29: qty 1

## 2017-03-29 MED ORDER — PHENAZOPYRIDINE HCL 200 MG PO TABS: 200.0000 mg | ORAL_TABLET | Freq: Three times a day (TID) | ORAL | 0 refills | Status: DC | PRN

## 2017-03-29 MED ORDER — CEPHALEXIN 500 MG PO CAPS: 500.0000 mg | ORAL_CAPSULE | Freq: Two times a day (BID) | ORAL | 0 refills | Status: AC

## 2017-03-29 NOTE — Discharge Instructions (Signed)
Your labs reveal a UTI. Take the antibiotic as directed. Your urine is being sent for culture. You will only be notified if there is a need to change your prescription.

## 2017-03-29 NOTE — ED Notes (Signed)
NAD noted at time of D/C. Pt denies questions or concerns. Pt ambulatory to the lobby at this time.  

## 2017-03-29 NOTE — ED Provider Notes (Signed)
[facility] Emergency Department Provider Note ____________________________________________  Time seen: 1319  I have reviewed the triage vital signs and the nursing notes.  HISTORY  Chief Complaint  Dysuria  HPI Martha Hill is a 51 y.o. female presents herself to the ED with complaints of generalized headache, burning with urination and general malaise.  She denies any recent fevers but did note fevers at the onset of symptoms about a week ago.  She gives a history of recurrent UTIs.  She describes her last treatment for UTI was about a month prior.  She denies any gross hematuria, pelvic pain, or significant abdominal pain.  Past Medical History:  Diagnosis Date  . Diabetes mellitus without complication ([place])   . Hypertension   . Renal cancer ([place])     There are no active problems to display for this patient.   Past Surgical History:  Procedure Laterality Date  . ABDOMINAL HYSTERECTOMY    . CHOLECYSTECTOMY      Prior to Admission medications   Medication Sig Start Date End Date Taking? Authorizing Provider  cephALEXin (KEFLEX) 500 MG capsule Take 1 capsule (500 mg total) by mouth 2 (two) times daily. 03/29/17 04/05/17  Astin Sayre, Dannielle Karvonen, PA-C  phenazopyridine (PYRIDIUM) 200 MG tablet Take 1 tablet (200 mg total) by mouth 3 (three) times daily as needed for pain. 03/29/17 03/29/18  Dewaine Morocho, Dannielle Karvonen, PA-C   Allergies Macrolides and ketolides and Sulfa antibiotics  Family History  Problem Relation Age of Onset  . Breast cancer Maternal Aunt 23  . Breast cancer Maternal Grandmother 70    Social History Social History  Substance Use Topics  . Smoking status: Current Every Day Smoker    Packs/day: 1.00    Types: Cigarettes  . Smokeless tobacco: Current User  . Alcohol use No    Review of Systems  Constitutional: Negative for fever. Cardiovascular: Negative for chest pain. Respiratory: Negative for shortness of breath. Reports  non-productive cough Gastrointestinal: Negative for abdominal pain, vomiting and diarrhea. Genitourinary: Positive for dysuria. Musculoskeletal: Negative for back pain. Skin: Negative for rash. Neurological: Negative for headaches, focal weakness or numbness. ____________________________________________  PHYSICAL EXAM:  VITAL SIGNS: ED Triage Vitals  Enc Vitals Group     BP 03/29/17 1255 (!) 120/50     Pulse Rate 03/29/17 1255 93     Resp 03/29/17 1255 18     Temp 03/29/17 1255 98.7 F (37.1 C)     Temp src --      SpO2 03/29/17 1255 94 %     Weight 03/29/17 1256 215 lb (97.5 kg)     Height 03/29/17 1256 5' (1.524 m)     Head Circumference --      Peak Flow --      Pain Score 03/29/17 1255 7     Pain Loc --      Pain Edu? --      Excl. in [place]? --     Constitutional: Alert and oriented. Well appearing and in no distress. Head: Normocephalic and atraumatic. Cardiovascular: Normal rate, regular rhythm. Normal distal pulses. Respiratory: Normal respiratory effort. No wheezes/rales/rhonchi. Gastrointestinal: Soft and nontender. No distention. No  CVA tenderness. Normoactive bowel sounds.  Musculoskeletal: Nontender with normal range of motion in all extremities.  Neurologic:  Normal gait without ataxia. Normal speech and language. No gross focal neurologic deficits are appreciated. ____________________________________________   LABS (pertinent positives/negatives)  Labs Reviewed  BASIC METABOLIC PANEL - Abnormal; Notable for the following:  Result Value   Chloride 98 (*)    Glucose, Bld 183 (*)    All other components within normal limits  URINALYSIS, COMPLETE (UACMP) WITH MICROSCOPIC - Abnormal; Notable for the following:    Color, Urine YELLOW (*)    APPearance CLOUDY (*)    Hgb urine dipstick SMALL (*)    Protein, ur 30 (*)    Nitrite POSITIVE (*)    Leukocytes, UA LARGE (*)    Bacteria, UA MANY (*)    Squamous Epithelial / LPF 6-30 (*)    All other components  within normal limits  URINE CULTURE  CBC  ____________________________________________  PROCEDURES  Keflex 500 mg PO ____________________________________________  INITIAL IMPRESSION / ASSESSMENT AND PLAN / ED COURSE  Patient with ED evaluation of a less than 1 week complaint of generalized malaise, headache, and dysuria.  She is found to have an acute cystitis on exam is any evidence by a large leukocyturia and nitrite positive urine. She is discharged with a prescription for Keflex & phenazopyrdine. A urine culture is pending at th time of discharge. Return precautions are reviewed.  ____________________________________________  FINAL CLINICAL IMPRESSION(S) / ED DIAGNOSES  Final diagnoses:  Acute cystitis with hematuria      Melvenia Needles, PA-C 03/29/17 Bonnie, [place], MD 03/29/17 2038

## 2017-03-29 NOTE — ED Triage Notes (Signed)
Patient comes in with generalized complaints of  Headache, burning with urination, and feeling tired.

## 2017-03-29 NOTE — ED Notes (Signed)
patient states she has vaginal pain 5/10, on urination 10/10, inspiratory and expiratory wheezes all lobes.

## 2017-03-31 LAB — URINE CULTURE
Culture: >=100000 — AB
Special Requests: NORMAL

## 2017-04-01 ENCOUNTER — Telehealth: Payer: Self-pay

## 2017-04-01 NOTE — Telephone Encounter (Signed)
Post ED Visit - Positive Culture Follow-up  Culture report reviewed by antimicrobial stewardship pharmacist:  []  Elenor Quinones, Pharm.D. []  Heide Guile, Pharm.D., BCPS AQ-ID []  Parks Neptune, Pharm.D., BCPS []  Alycia Rossetti, Pharm.D., BCPS []  [place], Pharm.D., BCPS, AAHIVP []  Legrand Como, Pharm.D., BCPS, AAHIVP []  Salome Arnt, PharmD, BCPS []  Dimitri Ped, PharmD, BCPS []  Vincenza Hews, PharmD, BCPS [facility] Pharm D Positive urine culture Treated with Cephalexin, organism sensitive to the same and no further patient follow-up is required at this time.  Genia Del 04/01/2017, 9:57 AM

## 2017-04-21 ENCOUNTER — Other Ambulatory Visit: Payer: Self-pay

## 2017-04-21 ENCOUNTER — Emergency Department
Admission: EM | Admit: 2017-04-21 | Discharge: 2017-04-21 | Disposition: A | Discharge status: Home / Self Care | Payer: Medicaid Other | Attending: Emergency Medicine | Admitting: Emergency Medicine

## 2017-04-21 ENCOUNTER — Emergency Department: Payer: Medicaid Other

## 2017-04-21 ENCOUNTER — Encounter: Payer: Self-pay | Admitting: Emergency Medicine

## 2017-04-21 DIAGNOSIS — J029 Acute pharyngitis, unspecified: Secondary | ICD-10-CM

## 2017-04-21 DIAGNOSIS — E119 Type 2 diabetes mellitus without complications: Secondary | ICD-10-CM | POA: Diagnosis not present

## 2017-04-21 DIAGNOSIS — R05 Cough: Secondary | ICD-10-CM | POA: Insufficient documentation

## 2017-04-21 DIAGNOSIS — I10 Essential (primary) hypertension: Secondary | ICD-10-CM | POA: Insufficient documentation

## 2017-04-21 DIAGNOSIS — F17210 Nicotine dependence, cigarettes, uncomplicated: Secondary | ICD-10-CM | POA: Insufficient documentation

## 2017-04-21 DIAGNOSIS — J40 Bronchitis, not specified as acute or chronic: Secondary | ICD-10-CM | POA: Insufficient documentation

## 2017-04-21 DIAGNOSIS — Z79899 Other long term (current) drug therapy: Secondary | ICD-10-CM | POA: Insufficient documentation

## 2017-04-21 DIAGNOSIS — M7918 Myalgia, other site: Secondary | ICD-10-CM | POA: Diagnosis not present

## 2017-04-21 LAB — POCT RAPID STREP A: Streptococcus, Group A Screen (Direct): NEGATIVE

## 2017-04-21 MED ORDER — PSEUDOEPH-BROMPHEN-DM 30-2-10 MG/5ML PO SYRP: 5.0000 mL | ORAL_SOLUTION | Freq: Four times a day (QID) | ORAL | 0 refills | Status: DC | PRN

## 2017-04-21 MED ORDER — DOXYCYCLINE HYCLATE 100 MG PO CAPS: 100.0000 mg | ORAL_CAPSULE | Freq: Two times a day (BID) | ORAL | 0 refills | Status: DC

## 2017-04-21 NOTE — ED Notes (Signed)
tmax 100 per pt. Fevers and URI sx X 1 month. Had abx 2 weeks ago but not better

## 2017-04-21 NOTE — ED Provider Notes (Signed)
[facility] Emergency Department Provider Note   ____________________________________________   First MD Initiated Contact with Patient 04/21/17 1601     (approximate)  I have reviewed the triage vital signs and the nursing notes.   HISTORY  Chief Complaint Sore Throat    HPI Martha Hill is a 51 y.o. female patient presented for complaint of sore throat for 5 days. Also patient complain of coughing for greater than 1 month. Patient states cough intermittently productive and nonproductive. Patient stated intermittent fever and chills with this complaint. Patient denies nausea vomiting diarrhea. Patient say generalized body aches secondary to coughing. No palliative measures for complaint. Patient rates the sore throat as a 6/10. Patient described a pain as "achy".   Past Medical History:  Diagnosis Date  . Diabetes mellitus without complication ([place])   . Hypertension   . Renal cancer ([place])     There are no active problems to display for this patient.   Past Surgical History:  Procedure Laterality Date  . ABDOMINAL HYSTERECTOMY    . CHOLECYSTECTOMY      Prior to Admission medications   Medication Sig Start Date End Date Taking? Authorizing Provider  brompheniramine-pseudoephedrine-DM 30-2-10 MG/5ML syrup Take 5 mLs 4 (four) times daily as needed by mouth. 04/21/17   Sable Feil, PA-C  doxycycline (VIBRAMYCIN) 100 MG capsule Take 1 capsule (100 mg total) 2 (two) times daily by mouth. 04/21/17   Sable Feil, PA-C  phenazopyridine (PYRIDIUM) 200 MG tablet Take 1 tablet (200 mg total) by mouth 3 (three) times daily as needed for pain. 03/29/17 03/29/18  Menshew, Dannielle Karvonen, PA-C    Allergies Macrolides and ketolides and Sulfa antibiotics  Family History  Problem Relation Age of Onset  . Breast cancer Maternal Aunt 32  . Breast cancer Maternal Grandmother 70    Social History Social History   Tobacco Use  . Smoking status:  Current Every Day Smoker    Packs/day: 1.00    Types: Cigarettes  . Smokeless tobacco: Current User  Substance Use Topics  . Alcohol use: No  . Drug use: No    Review of Systems Constitutional: No fever/chills Eyes: No visual changes. ENT: Sore throat  Cardiovascular: Denies chest pain. Respiratory: Denies shortness of breath. Chronic cough Gastrointestinal: No abdominal pain.  No nausea, no vomiting.  No diarrhea.  No constipation. Genitourinary: Negative for dysuria. Musculoskeletal: Negative for back pain. Skin: Negative for rash. Neurological: Negative for headaches, focal weakness or numbness. Endocrine:Diabetes and hypertension Allergic/Immunilogical: See medication list  ____________________________________________   PHYSICAL EXAM:  VITAL SIGNS: ED Triage Vitals  Enc Vitals Group     BP 04/21/17 1515 131/74     Pulse Rate 04/21/17 1515 98     Resp 04/21/17 1515 18     Temp 04/21/17 1515 98.1 F (36.7 C)     Temp Source 04/21/17 1515 Oral     SpO2 04/21/17 1515 96 %     Weight 04/21/17 1516 215 lb (97.5 kg)     Height 04/21/17 1516 5' (1.524 m)     Head Circumference --      Peak Flow --      Pain Score 04/21/17 1515 6     Pain Loc --      Pain Edu? --      Excl. in [place]? --    Constitutional: Alert and oriented. Well appearing and in no acute distress. Eyes: Conjunctivae are normal. PERRL. EOMI. Head: Atraumatic. Nose: Edematous  nasal turbinates bilateral maxillary guarding  Mouth/Throat: Mucous membranes are moist.  Oropharynx non-erythematous. Postnasal drainage Neck: No stridor.   Hematological/Lymphatic/Immunilogical: No cervical lymphadenopathy. Cardiovascular: Normal rate, regular rhythm. Grossly normal heart sounds.  Good peripheral circulation. Respiratory: Normal respiratory effort.  No retractions. Lungs CTAB. Gastrointestinal: Soft and nontender. No distention. No abdominal bruits. No CVA tenderness. Musculoskeletal: No lower extremity  tenderness nor edema.  No joint effusions. Neurologic:  Normal speech and language. No gross focal neurologic deficits are appreciated. No gait instability. Skin:  Skin is warm, dry and intact. No rash noted. Psychiatric: Mood and affect are normal. Speech and behavior are normal.  ____________________________________________   LABS (all labs ordered are listed, but only abnormal results are displayed)  Labs Reviewed  POCT RAPID STREP A   ____________________________________________  EKG   ____________________________________________  RADIOLOGY  Dg Chest 2 View  Result Date: 04/21/2017 CLINICAL DATA:  Productive cough for 1 month EXAM: CHEST  2 VIEW COMPARISON:  08/01/2011 FINDINGS: Mild peribronchial thickening. Heart and mediastinal contours are within normal limits. No focal opacities or effusions. No acute bony abnormality. IMPRESSION: Mild bronchitic changes. Electronically Signed   By: Rolm Baptise M.D.   On: 04/21/2017 16:14    ____________________________________________   PROCEDURES  Procedure(s) performed: None  Procedures  Critical Care performed: No  ____________________________________________   INITIAL IMPRESSION / ASSESSMENT AND PLAN / ED COURSE  As part of my medical decision making, I reviewed the following data within the electronic MEDICAL RECORD NUMBER      Pharyngitis and bronchitis. Discussed negative strep tests with patient. Discussed x-ray finding with patient. Patient given discharge care instructions and advised [facility].      ____________________________________________   FINAL CLINICAL IMPRESSION(S) / ED DIAGNOSES  Final diagnoses:  Viral pharyngitis  Bronchitis     ED Discharge Orders        Ordered    brompheniramine-pseudoephedrine-DM 30-2-10 MG/5ML syrup  4 times daily PRN     04/21/17 1658    doxycycline (VIBRAMYCIN) 100 MG capsule  2 times daily     04/21/17 1658        Note:  This document was prepared using Dragon voice recognition software and may include unintentional dictation errors.    David, [place], PA-C 04/21/17 1703    Schuyler Amor, MD 04/21/17 2101

## 2017-04-21 NOTE — ED Triage Notes (Signed)
Sore throat x 5 days

## 2017-07-04 ENCOUNTER — Emergency Department: Payer: Medicaid Other

## 2017-07-04 ENCOUNTER — Encounter: Payer: Self-pay | Admitting: *Deleted

## 2017-07-04 ENCOUNTER — Other Ambulatory Visit: Payer: Self-pay

## 2017-07-04 ENCOUNTER — Emergency Department
Admission: EM | Admit: 2017-07-04 | Discharge: 2017-07-04 | Disposition: A | Discharge status: Home / Self Care | Payer: Medicaid Other | Attending: Emergency Medicine | Admitting: Emergency Medicine

## 2017-07-04 DIAGNOSIS — F17210 Nicotine dependence, cigarettes, uncomplicated: Secondary | ICD-10-CM | POA: Diagnosis not present

## 2017-07-04 DIAGNOSIS — Z79899 Other long term (current) drug therapy: Secondary | ICD-10-CM | POA: Diagnosis not present

## 2017-07-04 DIAGNOSIS — N3091 Cystitis, unspecified with hematuria: Secondary | ICD-10-CM | POA: Insufficient documentation

## 2017-07-04 DIAGNOSIS — I10 Essential (primary) hypertension: Secondary | ICD-10-CM | POA: Insufficient documentation

## 2017-07-04 DIAGNOSIS — E119 Type 2 diabetes mellitus without complications: Secondary | ICD-10-CM | POA: Insufficient documentation

## 2017-07-04 DIAGNOSIS — Z85528 Personal history of other malignant neoplasm of kidney: Secondary | ICD-10-CM | POA: Insufficient documentation

## 2017-07-04 DIAGNOSIS — R319 Hematuria, unspecified: Secondary | ICD-10-CM | POA: Diagnosis present

## 2017-07-04 LAB — URINALYSIS, COMPLETE (UACMP) WITH MICROSCOPIC
BACTERIA UA: NONE SEEN
BILIRUBIN URINE: NEGATIVE
Glucose, UA: NEGATIVE mg/dL
Ketones, ur: NEGATIVE mg/dL
Nitrite: NEGATIVE
Protein, ur: NEGATIVE mg/dL
SPECIFIC GRAVITY, URINE: 1.01 (ref 1.005–1.030)
pH: 6 (ref 5.0–8.0)

## 2017-07-04 LAB — BASIC METABOLIC PANEL
Anion gap: 7 (ref 5–15)
BUN: 9 mg/dL (ref 6–20)
CALCIUM: 8.9 mg/dL (ref 8.9–10.3)
CHLORIDE: 103 mmol/L (ref 101–111)
CO2: 30 mmol/L (ref 22–32)
CREATININE: 0.74 mg/dL (ref 0.44–1.00)
GFR calc non Af Amer: >60 mL/min (ref >60)
Glucose, Bld: 136 mg/dL — ABNORMAL HIGH (ref 65–99)
Potassium: 3.6 mmol/L (ref 3.5–5.1)
SODIUM: 140 mmol/L (ref 135–145)

## 2017-07-04 LAB — CBC
HCT: 42.2 % (ref 35.0–47.0)
Hemoglobin: 13.9 g/dL (ref 12.0–16.0)
MCH: 28.8 pg (ref 26.0–34.0)
MCHC: 32.9 g/dL (ref 32.0–36.0)
MCV: 87.4 fL (ref 80.0–100.0)
PLATELETS: 192 10*3/uL (ref 150–440)
RBC: 4.83 MIL/uL (ref 3.80–5.20)
RDW: 14.1 % (ref 11.5–14.5)
WBC: 12.1 10*3/uL — AB (ref 3.6–11.0)

## 2017-07-04 MED ORDER — CEPHALEXIN 500 MG PO CAPS: 500.0000 mg | ORAL_CAPSULE | Freq: Two times a day (BID) | ORAL | 0 refills | Status: DC

## 2017-07-04 MED ORDER — IOPAMIDOL (ISOVUE-370) INJECTION 76%
75.0000 mL | Freq: Once | INTRAVENOUS | Status: AC | PRN
  Administered 2017-07-04: 75 mL via INTRAVENOUS
  Filled 2017-07-04: qty 75

## 2017-07-04 NOTE — ED Triage Notes (Signed)
Pt to ED reporting she noted bright red blood and clots in urine beginning this morning. Pt noted diarrhea and cold chills as well. Right and left flank pain with worsening pain in the lower center of back. Intermittent burning with urination reported.

## 2017-07-04 NOTE — ED Notes (Signed)
First Nurse Note:  Patient complaining of hematuria and pain with urination.  Given urine specimen cup.  Alert and oriented.  Color good.

## 2017-07-04 NOTE — ED Provider Notes (Signed)
[facility] Emergency Department Provider Note  ____________________________________________   First MD Initiated Contact with Patient 07/04/17 1737     (approximate)  I have reviewed the triage vital signs and the nursing notes.   HISTORY  Chief Complaint Back Pain and Hematuria    HPI Martha Hill is a 52 y.o. female with medical history as listed below which includes left-sided partial nephrectomy for renal cell cancer.  She presents today for evaluation of acute onset hematuria that includes blood clots.  The symptoms started when she got up this morning.  She denies dysuria, and has had some chills but no fever.  She has left-sided flank pain and has not had similar symptoms in the past.  She has had urinary tract infections but it always presents with dysuria which is absent this time.  Nothing in particular makes her symptoms better nor worse.  She has had some nausea but no vomiting.  She denies chest pain or shortness of breath.  She describes the symptoms as moderate but the bleeding itself as severe.  She is status post hysterectomy  Past Medical History:  Diagnosis Date  . Diabetes mellitus without complication ([place])   . Hypertension   . Renal cancer ([place])     There are no active problems to display for this patient.   Past Surgical History:  Procedure Laterality Date  . ABDOMINAL HYSTERECTOMY    . CHOLECYSTECTOMY      Prior to Admission medications   Medication Sig Start Date End Date Taking? Authorizing Provider  brompheniramine-pseudoephedrine-DM 30-2-10 MG/5ML syrup Take 5 mLs 4 (four) times daily as needed by mouth. 04/21/17   Sable Feil, PA-C  doxycycline (VIBRAMYCIN) 100 MG capsule Take 1 capsule (100 mg total) 2 (two) times daily by mouth. 04/21/17   Sable Feil, PA-C  phenazopyridine (PYRIDIUM) 200 MG tablet Take 1 tablet (200 mg total) by mouth 3 (three) times daily as needed for pain. 03/29/17 03/29/18  Menshew,  Dannielle Karvonen, PA-C    Allergies Macrolides and ketolides and Sulfa antibiotics  Family History  Problem Relation Age of Onset  . Breast cancer Maternal Aunt 61  . Breast cancer Maternal Grandmother 70    Social History Social History   Tobacco Use  . Smoking status: Current Every Day Smoker    Packs/day: 1.00    Types: Cigarettes  . Smokeless tobacco: Current User  Substance Use Topics  . Alcohol use: No  . Drug use: No    Review of Systems Constitutional: No fever/chills Eyes: No visual changes. ENT: No sore throat. Cardiovascular: Denies chest pain. Respiratory: Denies shortness of breath. Gastrointestinal: Left Sided flank pain.  No abdominal pain.  Nausea, no vomiting.  No diarrhea.  No constipation. Genitourinary: Hematuria Musculoskeletal: Negative for neck pain.  Negative for back pain. Integumentary: Negative for rash. Neurological: Negative for headaches, focal weakness or numbness.   ____________________________________________   PHYSICAL EXAM:  VITAL SIGNS: ED Triage Vitals  Enc Vitals Group     BP 07/04/17 1544 99/67     Pulse Rate 07/04/17 1544 91     Resp 07/04/17 1544 16     Temp 07/04/17 1544 98.4 F (36.9 C)     Temp Source 07/04/17 1544 Oral     SpO2 07/04/17 1544 95 %     Weight 07/04/17 1545 99.8 kg (220 lb)     Height 07/04/17 1545 1.524 m (5')     Head Circumference --  Peak Flow --      Pain Score 07/04/17 1545 8     Pain Loc --      Pain Edu? --      Excl. in [place]? --     Constitutional: Alert and oriented. Well appearing and in no acute distress. Eyes: Conjunctivae are normal.  Head: Atraumatic. Nose: No congestion/rhinnorhea. Mouth/Throat: Mucous membranes are moist. Neck: No stridor.  No meningeal signs.   Cardiovascular: Normal rate, regular rhythm. Good peripheral circulation. Grossly normal heart sounds. Respiratory: Normal respiratory effort.  No retractions. Lungs CTAB. Gastrointestinal: Soft with very mild  tenderness to palpation of the left side of the abdomen, no rebound and no guarding.  No CVA tenderness on either side Musculoskeletal: No lower extremity tenderness nor edema. No gross deformities of extremities. Neurologic:  Normal speech and language. No gross focal neurologic deficits are appreciated.  Skin:  Skin is warm, dry and intact. No rash noted. Psychiatric: Mood and affect are normal. Speech and behavior are normal.  ____________________________________________   LABS (all labs ordered are listed, but only abnormal results are displayed)  Labs Reviewed  URINALYSIS, COMPLETE (UACMP) WITH MICROSCOPIC - Abnormal; Notable for the following components:      Result Value   Color, Urine YELLOW (*)    APPearance HAZY (*)    Hgb urine dipstick LARGE (*)    Leukocytes, UA MODERATE (*)    Squamous Epithelial / LPF 0-5 (*)    All other components within normal limits  BASIC METABOLIC PANEL - Abnormal; Notable for the following components:   Glucose, Bld 136 (*)    All other components within normal limits  CBC - Abnormal; Notable for the following components:   WBC 12.1 (*)    All other components within normal limits  URINE CULTURE   ____________________________________________  EKG  None - EKG not ordered by ED physician ____________________________________________  RADIOLOGY   ED MD interpretation:  No acute/emergent abnormalities except cystitis  Official radiology report(s): Ct Abdomen Pelvis W Contrast  Result Date: 07/04/2017 CLINICAL DATA:  Hematuria. Left flank pain. History of partial nephrectomy for renal cell cancer. EXAM: CT ABDOMEN AND PELVIS WITH CONTRAST TECHNIQUE: Multidetector CT imaging of the abdomen and pelvis was performed using the standard protocol following bolus administration of intravenous contrast. CONTRAST:  54mL ISOVUE-370 IOPAMIDOL (ISOVUE-370) INJECTION 76% COMPARISON:  07/16/2008 FINDINGS: Lower chest: Lung bases are clear. No effusions.  Heart is normal size. Hepatobiliary: Subtle nodularity to the liver surface suggests early cirrhosis. Mild low-density <dictation error> at diffuse low-density throughout the liver without focal mass. Prior cholecystectomy. Pancreas: No focal abnormality or ductal dilatation. Spleen: No focal abnormality.  Normal size. Adrenals/Urinary Tract: Postoperative changes in the mid to lower pole region of the left kidney laterally, likely related to prior cryoablation or partial nephrectomy. No hydronephrosis. No renal or ureteral stones. No residual recurrent mass. Adrenal glands are unremarkable. Urinary bladder is diffusely thick walled with haziness around the bladder concerning for cystitis. Stomach/Bowel: Stomach, large and small bowel grossly unremarkable. Appendix is normal. Vascular/Lymphatic: Aortic and iliac calcifications. No aneurysm or adenopathy. Reproductive: Prior hysterectomy.  No adnexal masses. Other: No free fluid or free air. Musculoskeletal: No acute bony abnormality. IMPRESSION: No recurrent mass within the left kidney. No renal or ureteral stones. No hydronephrosis. Mild diffuse bladder wall thickening and haziness surrounding the bladder wall concerning for cystitis. Recommend clinical correlation. Diffuse low-density throughout the liver suggests fatty infiltration. Subtle nodularity to the surface suggests the possibility of early cirrhosis.  Prior cholecystectomy. Aortoiliac atherosclerosis. Electronically Signed   By: Rolm Baptise M.D.   On: 07/04/2017 18:42    ____________________________________________   PROCEDURES  Critical Care performed: No   Procedure(s) performed:   Procedures   ____________________________________________   INITIAL IMPRESSION / ASSESSMENT AND PLAN / ED COURSE  As part of my medical decision making, I reviewed the following data within the [place] notes reviewed and incorporated, Labs reviewed , Old chart reviewed and  Discussed with radiologist    Differential diagnosis includes, but is not limited to, kidney/ureteral stone, UTI, pyelonephritis, recurrence of renal cell carcinoma, less likely glomerular nephritis.  Lab results are generally reassuring with a mild leukocytosis.  Gross blood in the urine makes it difficult to appreciate the presence of infection although it is nitrite negative.  I called and discussed the case by phone with radiology who recommended that, given her history, we use IV contrast for a CT scan rather than using the without contrast renal stone protocol.  Patient agrees with the plan.  Clinical Course as of Jul 05 1939  Wed Jul 04, 2017  2694 The radiologist called to discuss the results.  He said there is nothing acute in the kidney but the bladder does look thickened cystitis.  I discussed the results with the patient.  Given my concern for the hematuria and the cystitis and the need for urology follow-up for probable cystoscopy, I strongly encouraged her to follow-up as planned.  Given that she has a mild leukocytosis I am going to start her on antibiotics and a urine culture is pending although I do not believe this is simply an infectious cystitis.  She says that she will follow-up with Dr. Erlene Quan or 1 of her colleagues.  [CF]    Clinical Course User Index [CF] Hinda Kehr, MD    ____________________________________________  FINAL CLINICAL IMPRESSION(S) / ED DIAGNOSES  Final diagnoses:  Hemorrhagic cystitis  Hematuria, unspecified type     MEDICATIONS GIVEN DURING THIS VISIT:  Medications  iopamidol (ISOVUE-370) 76 % injection 75 mL (75 mLs Intravenous Contrast Given 07/04/17 1822)     ED Discharge Orders    None       Note:  This document was prepared using Dragon voice recognition software and may include unintentional dictation errors.    Hinda Kehr, MD 07/04/17 [phone]

## 2017-07-04 NOTE — Discharge Instructions (Signed)
It is important that you follow-up with Dr. Erlene Quan or 1 of her urology colleagues to discuss whether or not additional evaluation is needed for your cystitis and hematuria (inflamed bladder and blood in your urine).  You may benefit from a procedure where they look directly at the inside of the bladder with a camera.  Please call Dr. Cherrie Gauze office to schedule a follow-up appointment; they may also reach out to you to schedule an appointment.  Return to the emergency department if you develop new or worsening symptoms that concern you.

## 2017-07-05 ENCOUNTER — Telehealth: Payer: Self-pay | Admitting: Urology

## 2017-07-05 NOTE — Telephone Encounter (Signed)
App made Had to leave a message for her to call back  Martha Hill

## 2017-07-05 NOTE — Telephone Encounter (Signed)
-----   Message from Hollice Espy, MD sent at 07/05/2017  8:41 AM EST ----- Regarding: RE: Hemorrhagic cystitis follow-up Agreed.  Looks like she was seen by her PCP in 01/2017 with similar situation and culture was negative.  Needs hematuria work up.  Will arrange.    Sharyn Lull, can you reach out to the patient and encourage her to schedule an appt?  Hollice Espy, MD   ----- Message ----- From: Hinda Kehr, MD Sent: 07/04/2017   7:43 PM To: Hollice Espy, MD Subject: Hemorrhagic cystitis follow-up                 Hi Caryl Pina,  I would appreciate it if you would help make sure that this patient gets a follow-up appointment with you.  I think she might benefit from a cystoscopy.  She has a history of renal cell carcinoma and is having gross hematuria with the appearance of cystitis on CT scan.  I am treating her empirically for UTI although I doubt that a urinary tract infection explains the degree of gross hematuria she is having.  I appreciate your assistance with follow-up.  I stressed to her that it is very important.  Thank you,    -- Tommi Rumps

## 2017-07-06 LAB — URINE CULTURE: SPECIAL REQUESTS: NORMAL

## 2017-07-13 ENCOUNTER — Ambulatory Visit (INDEPENDENT_AMBULATORY_CARE_PROVIDER_SITE_OTHER): Payer: Self-pay | Admitting: Urology

## 2017-07-13 ENCOUNTER — Encounter: Payer: Self-pay | Admitting: Urology

## 2017-07-13 VITALS — BP 119/80 | HR 94 | Ht 64.0 in | Wt 220.0 lb

## 2017-07-13 DIAGNOSIS — R350 Frequency of micturition: Secondary | ICD-10-CM

## 2017-07-13 DIAGNOSIS — R3129 Other microscopic hematuria: Secondary | ICD-10-CM

## 2017-07-13 DIAGNOSIS — N3289 Other specified disorders of bladder: Secondary | ICD-10-CM

## 2017-07-13 LAB — URINALYSIS, COMPLETE
Bilirubin, UA: NEGATIVE
GLUCOSE, UA: NEGATIVE
Ketones, UA: NEGATIVE
LEUKOCYTES UA: NEGATIVE
Nitrite, UA: NEGATIVE
PH UA: 7.5 (ref 5.0–7.5)
PROTEIN UA: NEGATIVE
RBC, UA: NEGATIVE
Specific Gravity, UA: 1.015 (ref 1.005–1.030)
Urobilinogen, Ur: 0.2 mg/dL (ref 0.2–1.0)

## 2017-07-13 LAB — MICROSCOPIC EXAMINATION
RBC, UA: NONE SEEN /hpf (ref 0–?)
WBC, UA: NONE SEEN /hpf (ref 0–?)

## 2017-07-13 LAB — BLADDER SCAN AMB NON-IMAGING: Scan Result: 34

## 2017-07-13 NOTE — Progress Notes (Signed)
07/13/2017 12:08 PM   [name] September 09, 1965 [id number]  Referring provider: Rusty Aus, MD [facility] [place], [place] [address]  Chief Complaint  Patient presents with  . Hematuria    New patient    HPI: 52 year old smoker who presents today with recurrent episodes of gross hematuria for the past year.  She has had several episodes of relatively asymptomatic painless gross hematuria, occasionally with clots which is been self-limited.  Occasionally having these episodes are associated with dysuria but not always.  On most occasions, urine culture showed no bacteria.  She did have an E. coli urinary tract infection in 03/2017.    Most recently, she was seen in the emergency room 07/04/2017 with similar symptoms.  UA was significant for large Hgb but negative LE, TNTC WBC and RBC, nitritie negative.  UCx with mixed species, nonclonoal.    CT abdomen with pelvis showed mild diffuse bladder wall thickening and haziness around the bladder concerning for cystitis.  No renal masses.  She did have incidental liver changes suggestive of fatty liver with early cirrhosis.  She does have a personal history of renal cell carcinoma status post laparoscopic partial nephrectomy in 2008 performed at [facility].  She does have a personal history of smoking and continues to smoke approximately 1 pack/day currently.  She does have chronic low back pain but denies any flank pain.  No history of kidney stones.  Today, she denies any significant urinary symptoms.  She does have urinary frequency but attributes this to Lasix.  PMH: Past Medical History:  Diagnosis Date  . Arthritis   . Diabetes mellitus without complication ([place])   . GERD (gastroesophageal reflux disease)   . Gout   . Hypertension   . Renal cancer ([place])   . Sleep apnea     Surgical History: Past Surgical History:  Procedure Laterality Date  . ABDOMINAL HYSTERECTOMY    .  CHOLECYSTECTOMY    . ROBOTIC ASSITED PARTIAL NEPHRECTOMY Left 2008  . TONSILLECTOMY      Home Medications:  Allergies as of 07/13/2017      Reactions   Macrolides And Ketolides Nausea And Vomiting   Azithromycin/erythromycin   Sulfa Antibiotics Other (See Comments)   Contact dermatitis due to intravaginal preparation      Medication List        Accurate as of 07/13/17 12:08 PM. Always use your most recent med list.          cyanocobalamin 2000 MCG tablet Take 2,000 mcg by mouth daily.   furosemide 10 MG/ML solution Commonly known as:  LASIX Take by mouth.   HYDROcodone-acetaminophen 5-325 MG tablet Commonly known as:  NORCO/VICODIN Take by mouth.   lisinopril 10 MG tablet Commonly known as:  PRINIVIL,ZESTRIL Take 10 mg by mouth.   Magnesium Gluconate 550 MG Tabs Take 30 mg by mouth.   metFORMIN 500 MG tablet Commonly known as:  GLUCOPHAGE TAKE ONE TABLET BY MOUTH TWICE DAILY BEFORE MEAL(S)   omeprazole 20 MG capsule Commonly known as:  PRILOSEC TAKE ONE CAPSULE BY MOUTH TWICE DAILY   potassium chloride 10 MEQ tablet Commonly known as:  K-DUR TAKE ONE TABLET BY MOUTH THREE TIMES DAILY   Vitamin D3 2000 units capsule Take by mouth.       Allergies:  Allergies  Allergen Reactions  . Macrolides And Ketolides Nausea And Vomiting    Azithromycin/erythromycin  . Sulfa Antibiotics Other (See Comments)    Contact dermatitis  due to intravaginal preparation    Family History: Family History  Problem Relation Age of Onset  . Breast cancer Maternal Aunt 12  . Breast cancer Maternal Grandmother 67    Social History:  reports that she has been smoking cigarettes.  She has been smoking about 1.00 pack per day. She uses smokeless tobacco. She reports that she does not drink alcohol or use drugs.  ROS: UROLOGY Frequent Urination?: Yes Hard to postpone urination?: No Burning/pain with urination?: Yes Get up at night to urinate?: Yes Leakage of urine?:  Yes Urine stream starts and stops?: Yes Trouble starting stream?: No Do you have to strain to urinate?: No Blood in urine?: Yes Urinary tract infection?: Yes Sexually transmitted disease?: No Injury to kidneys or bladder?: No Painful intercourse?: No Weak stream?: No Currently pregnant?: No Vaginal bleeding?: No Last menstrual period?: n  Gastrointestinal Nausea?: Yes Vomiting?: No Indigestion/heartburn?: No Diarrhea?: Yes Constipation?: No  Constitutional Fever: Yes Night sweats?: No Weight loss?: No Fatigue?: Yes  Skin Skin rash/lesions?: No Itching?: No  Eyes Blurred vision?: No Double vision?: No  Ears/Nose/Throat Sore throat?: No Sinus problems?: Yes  Hematologic/Lymphatic Swollen glands?: No Easy bruising?: Yes  Cardiovascular Leg swelling?: Yes Chest pain?: No  Respiratory Cough?: Yes Shortness of breath?: Yes  Endocrine Excessive thirst?: No  Musculoskeletal Back pain?: Yes Joint pain?: Yes  Neurological Headaches?: Yes Dizziness?: No  Psychologic Depression?: No Anxiety?: No  Physical Exam: BP 119/80   Pulse 94   Ht 5\' 4"  (1.626 m)   Wt 220 lb (99.8 kg)   BMI 37.76 kg/m   Constitutional:  Alert and oriented, No acute distress. HEENT: [place] AT, moist mucus membranes.  Trachea midline, no masses. Cardiovascular: No clubbing, cyanosis, or edema. Respiratory: Normal respiratory effort, no increased work of breathing. GI: Abdomen is soft, nontender, nondistended, no abdominal masses GU: No CVA tenderness.  Normal external genitalia and urethral meatus. Skin: No rashes, bruises or suspicious lesions. Neurologic: Grossly intact, no focal deficits, moving all 4 extremities. Psychiatric: Normal mood and affect.  Laboratory Data: Lab Results  Component Value Date   WBC 12.1 (H) 07/04/2017   HGB 13.9 07/04/2017   HCT 42.2 07/04/2017   MCV 87.4 07/04/2017   PLT 192 07/04/2017    Lab Results  Component Value Date   CREATININE  0.74 07/04/2017    Urinalysis Results for orders placed or performed in visit on 07/13/17  Microscopic Examination  Result Value Ref Range   WBC, UA None seen 0 - 5 /hpf   RBC, UA None seen 0 - 2 /hpf   Epithelial Cells (non renal) 0-10 0 - 10 /hpf   Crystals Present (A) N/A   Crystal Type Amorphous Sediment N/A   Mucus, UA Present (A) Not Estab.   Bacteria, UA Many (A) None seen/Few  Urinalysis, Complete  Result Value Ref Range   Specific Gravity, UA 1.015 1.005 - 1.030   pH, UA 7.5 5.0 - 7.5   Color, UA Yellow Yellow   Appearance Ur Cloudy (A) Clear   Leukocytes, UA Negative Negative   Protein, UA Negative Negative/Trace   Glucose, UA Negative Negative   Ketones, UA Negative Negative   RBC, UA Negative Negative   Bilirubin, UA Negative Negative   Urobilinogen, Ur 0.2 0.2 - 1.0 mg/dL   Nitrite, UA Negative Negative   Microscopic Examination See below:   BLADDER SCAN AMB NON-IMAGING  Result Value Ref Range   Scan Result 34     Pertinent Imaging: CLINICAL  DATA:  Hematuria. Left flank pain. History of partial nephrectomy for renal cell cancer.  EXAM: CT ABDOMEN AND PELVIS WITH CONTRAST  TECHNIQUE: Multidetector CT imaging of the abdomen and pelvis was performed using the standard protocol following bolus administration of intravenous contrast.  CONTRAST:  31mL ISOVUE-370 IOPAMIDOL (ISOVUE-370) INJECTION 76%  COMPARISON:  07/16/2008  FINDINGS: Lower chest: Lung bases are clear. No effusions. Heart is normal size.  Hepatobiliary: Subtle nodularity to the liver surface suggests early cirrhosis. Mild low-density at diffuse low-density throughout the liver without focal mass. Prior cholecystectomy.  Pancreas: No focal abnormality or ductal dilatation.  Spleen: No focal abnormality.  Normal size.  Adrenals/Urinary Tract: Postoperative changes in the mid to lower pole region of the left kidney laterally, likely related to prior cryoablation or partial  nephrectomy. No hydronephrosis. No renal or ureteral stones. No residual recurrent mass. Adrenal glands are unremarkable. Urinary bladder is diffusely thick walled with haziness around the bladder concerning for cystitis.  Stomach/Bowel: Stomach, large and small bowel grossly unremarkable. Appendix is normal.  Vascular/Lymphatic: Aortic and iliac calcifications. No aneurysm or adenopathy.  Reproductive: Prior hysterectomy.  No adnexal masses.  Other: No free fluid or free air.  Musculoskeletal: No acute bony abnormality.  IMPRESSION: No recurrent mass within the left kidney. No renal or ureteral stones. No hydronephrosis.  Mild diffuse bladder wall thickening and haziness surrounding the bladder wall concerning for cystitis. Recommend clinical correlation.  Diffuse low-density throughout the liver suggests fatty infiltration. Subtle nodularity to the surface suggests the possibility of early cirrhosis.  Prior cholecystectomy.  Aortoiliac atherosclerosis.   Electronically Signed   By: Rolm Baptise M.D.   On: 07/04/2017 18:42  CT scan personally reviewed today.  Assessment & Plan:    1.Gross hematuria Recurrent episodes of gross hematuria occasionally without associated symptoms  On multiple occasions, associated urine cultures have been nonclonal  Given her smoking history, I do think it is extremely prudent to proceed with cystoscopy today to rule out bladder pathology, see cystoscopy note.  - Urinalysis, Complete  2. Urinary frequency No evidence of incomplete bladder emptying Likely behavioral/medication related - BLADDER SCAN AMB NON-IMAGING   Return in about 6 weeks (around 08/24/2017) for cysto.  Hollice Espy, MD  [facility] [address] [place], [place] [address] [phone]

## 2017-07-14 NOTE — Progress Notes (Signed)
   07/14/17  CC:  Chief Complaint  Patient presents with  . Hematuria    New patient    HPI: See associated new patient visit note  Blood pressure 119/80, pulse 94, height 5\' 4"  (1.626 m), weight 220 lb (99.8 kg). NED. A&Ox3.   No respiratory distress   Abd soft, NT, ND Normal external genitalia with patent urethral meatus  Cystoscopy Procedure Note  Patient identification was confirmed, informed consent was obtained, and patient was prepped using Betadine solution.  Lidocaine jelly was administered per urethral meatus.    Preoperative abx where received prior to procedure.    Procedure: - Flexible cystoscope introduced, without any difficulty.   - Thorough search of the bladder revealed:    normal urethral meatus    Diffuse nonspecific erythema throughout the entire bladder    no stones    no ulcers     no tumors    no urethral polyps    no trabeculation  - Ureteral orifices were normal in position and appearance.  Post-Procedure: - Patient tolerated the procedure well  Assessment/ Plan:  1. Erythematous bladder mucosa Differential diagnosis and findings today were reviewed with the patient including cystitis, inflammatory conditions of the bladder, or underlying malignancy  Urine cytology sent today  I recommended proceeding to the operating room for bladder biopsies, B RTG  She is in the process of applying for Medicaid which is pending.  She is concerned about the cost of a surgical procedure at this time.  We discussed that if her urine cytology is suspicious or positive, would strongly recommend proceeding now for biopsies.  If her urine cytology is negative, I read recommend returning to the office in 6 weeks for repeat cystoscopy given close proximity of recent episode of gross hematuria to today's cystoscopy, may have some residual inflammation or cystitis.    Hollice Espy, MD

## 2017-07-17 ENCOUNTER — Telehealth: Payer: Self-pay | Admitting: Urology

## 2017-07-17 LAB — CULTURE, URINE COMPREHENSIVE

## 2017-07-17 NOTE — Telephone Encounter (Signed)
Patient is asking for a ABX for her hematuria says that if she takes one it helps with the bleeding. Can someone call her back to discuss.   Sharyn Lull

## 2017-07-17 NOTE — Telephone Encounter (Signed)
Spoke with pt in reference to bleeding. Made pt aware ucx from Friday was negative and would need another urine specimen prior to abx being given. Pt voiced understanding stating she lives to far away to ride over.

## 2017-07-18 ENCOUNTER — Other Ambulatory Visit: Payer: Self-pay | Admitting: Urology

## 2017-08-23 ENCOUNTER — Encounter: Payer: Self-pay | Admitting: Urology

## 2017-08-23 ENCOUNTER — Ambulatory Visit (INDEPENDENT_AMBULATORY_CARE_PROVIDER_SITE_OTHER): Payer: Medicaid Other | Admitting: Urology

## 2017-08-23 VITALS — BP 117/76 | HR 96 | Ht 61.0 in | Wt 234.0 lb

## 2017-08-23 DIAGNOSIS — R310 Gross hematuria: Secondary | ICD-10-CM | POA: Diagnosis not present

## 2017-08-23 LAB — MICROSCOPIC EXAMINATION: RBC, UA: NONE SEEN /hpf (ref 0–2)

## 2017-08-23 LAB — URINALYSIS, COMPLETE
BILIRUBIN UA: NEGATIVE
Glucose, UA: NEGATIVE
Ketones, UA: NEGATIVE
LEUKOCYTES UA: NEGATIVE
Nitrite, UA: NEGATIVE
PH UA: 5.5 (ref 5.0–7.5)
PROTEIN UA: NEGATIVE
RBC UA: NEGATIVE
SPEC GRAV UA: 1.025 (ref 1.005–1.030)
Urobilinogen, Ur: 0.2 mg/dL (ref 0.2–1.0)

## 2017-08-23 NOTE — Progress Notes (Signed)
   08/23/17  CC:  Chief Complaint  Patient presents with  . Cysto    HPI: 52 year old female with recurrent episodes of gross hematuria.  See previous note for details.   CT abdomen pelvis with contrast shows diffuse bladder wall thickening concerning for cystitis.    She does have a personal history of renal cell carcinoma status post nephrectomy in 2008.  She does have a personal history of smoking and continues to smoke approximately 1 pack/day currently.  She presents to the office today for cystoscopy to evaluate her bladder.  Blood pressure 117/76, pulse 96, height 5\' 1"  (1.549 m), weight 234 lb (106.1 kg). NED. A&Ox3.   No respiratory distress   Abd soft, NT, ND Normal external genitalia with patent urethral meatus  Cystoscopy Procedure Note  Patient identification was confirmed, informed consent was obtained, and patient was prepped using Betadine solution.  Lidocaine jelly was administered per urethral meatus.    Preoperative abx where received prior to procedure.    Procedure: - Flexible cystoscope introduced, without any difficulty.   - Thorough search of the bladder revealed:    normal urethral meatus    normal urothelium    no stones    no ulcers     no tumors    no urethral polyps    no trabeculation  - Ureteral orifices were normal in position and appearance.  Post-Procedure: - Patient tolerated the procedure well  Assessment/ Plan:  1. Gross hematuria Cystoscopy today unremarkable Given her smoking history and continued recurrent episodes of gross hematuria, did feel her workup warrants delayed imaging with CT urogram.    Alternative including bilateral retrograde pyelogram in the operating room was discussed.  She is emergent CT urogram.  Will order this study and have her follow-up thereafter. - Urinalysis, Complete  F/u after CT urogram  Hollice Espy, MD

## 2017-08-24 MED ORDER — LEVOFLOXACIN 500 MG PO TABS: 500.0000 mg | ORAL_TABLET | Freq: Once | ORAL | Status: DC

## 2017-08-24 MED ORDER — LIDOCAINE HCL 2 % EX GEL: 1.0000 "application " | Freq: Once | CUTANEOUS | Status: DC

## 2017-08-29 DIAGNOSIS — R3129 Other microscopic hematuria: Secondary | ICD-10-CM

## 2017-09-12 ENCOUNTER — Ambulatory Visit
Admission: RE | Admit: 2017-09-12 | Discharge: 2017-09-12 | Disposition: A | Discharge status: Home / Self Care | Payer: Medicaid Other | Source: Ambulatory Visit | Attending: Urology | Admitting: Urology

## 2017-09-12 DIAGNOSIS — I700 Atherosclerosis of aorta: Secondary | ICD-10-CM | POA: Diagnosis not present

## 2017-09-12 DIAGNOSIS — N2889 Other specified disorders of kidney and ureter: Secondary | ICD-10-CM | POA: Insufficient documentation

## 2017-09-12 DIAGNOSIS — N200 Calculus of kidney: Secondary | ICD-10-CM | POA: Insufficient documentation

## 2017-09-12 DIAGNOSIS — R310 Gross hematuria: Secondary | ICD-10-CM | POA: Diagnosis present

## 2017-09-12 LAB — POCT I-STAT CREATININE: CREATININE: 0.7 mg/dL (ref 0.44–1.00)

## 2017-09-12 MED ORDER — IOPAMIDOL (ISOVUE-300) INJECTION 61%
125.0000 mL | Freq: Once | INTRAVENOUS | Status: AC | PRN
  Administered 2017-09-12: 125 mL via INTRAVENOUS

## 2017-09-25 ENCOUNTER — Encounter: Payer: Self-pay | Admitting: Urology

## 2017-09-25 ENCOUNTER — Ambulatory Visit (INDEPENDENT_AMBULATORY_CARE_PROVIDER_SITE_OTHER): Payer: Medicaid Other | Admitting: Urology

## 2017-09-25 VITALS — BP 107/69 | HR 90 | Resp 16 | Ht 61.0 in | Wt 236.2 lb

## 2017-09-25 DIAGNOSIS — N289 Disorder of kidney and ureter, unspecified: Secondary | ICD-10-CM

## 2017-09-25 DIAGNOSIS — F17200 Nicotine dependence, unspecified, uncomplicated: Secondary | ICD-10-CM

## 2017-09-25 DIAGNOSIS — R3129 Other microscopic hematuria: Secondary | ICD-10-CM | POA: Diagnosis not present

## 2017-09-25 DIAGNOSIS — Z85528 Personal history of other malignant neoplasm of kidney: Secondary | ICD-10-CM | POA: Diagnosis not present

## 2017-09-25 LAB — URINALYSIS, COMPLETE
BILIRUBIN UA: NEGATIVE
Ketones, UA: NEGATIVE
Leukocytes, UA: NEGATIVE
Nitrite, UA: NEGATIVE
RBC UA: NEGATIVE
Specific Gravity, UA: 1.015 (ref 1.005–1.030)
Urobilinogen, Ur: 0.2 mg/dL (ref 0.2–1.0)
pH, UA: 7.5 (ref 5.0–7.5)

## 2017-09-25 LAB — MICROSCOPIC EXAMINATION
Epithelial Cells (non renal): >10 /hpf — ABNORMAL HIGH (ref 0–10)
RBC, UA: NONE SEEN /hpf (ref 0–2)
WBC UA: NONE SEEN /HPF (ref 0–5)

## 2017-09-25 NOTE — Progress Notes (Signed)
09/25/2017 7:57 PM   Martha Hill 1965-07-06 [id number]  Referring provider: Rusty Aus, MD [place] [facility] [place], [place] [address]  No chief complaint on file.   HPI: 52 year old smoker who returns today to review her CT urogram  She had several episodes of gross hematuria.  She underwent cystoscopy last visit which was fairly unremarkable.  Her urine cytology was negative.  CT urogram today reveals no significant pathology.  Findings as below and reviewed today personally with the patient.  She does have a personal history of renal cell carcinoma status post laparoscopic partial nephrectomy in 2008 performed at [facility].  She does have a personal history of smoking and continues to smoke approximately 1 pack/day currently.  No voiding complaints today.  No further gross hematuria.    PMH: Past Medical History:  Diagnosis Date  . Arthritis   . Diabetes mellitus without complication ([place])   . GERD (gastroesophageal reflux disease)   . Gout   . Hypertension   . Renal cancer ([place]) 2008  . Sleep apnea     Surgical History: Past Surgical History:  Procedure Laterality Date  . ABDOMINAL HYSTERECTOMY    . CHOLECYSTECTOMY    . ROBOTIC ASSITED PARTIAL NEPHRECTOMY Left 2008  . TONSILLECTOMY      Home Medications:  Allergies as of 09/25/2017      Reactions   Macrolides And Ketolides Nausea And Vomiting   Azithromycin/erythromycin   Sulfa Antibiotics Other (See Comments)   Contact dermatitis due to intravaginal preparation      Medication List        Accurate as of 09/25/17  7:57 PM. Always use your most recent med list.          cyanocobalamin 2000 MCG tablet Take 2,000 mcg by mouth daily.   furosemide 10 MG/ML solution Commonly known as:  LASIX Take by mouth.   HYDROcodone-acetaminophen 5-325 MG tablet Commonly known as:  NORCO/VICODIN Take by mouth.   lisinopril 10 MG tablet Commonly known as:   PRINIVIL,ZESTRIL Take 10 mg by mouth.   Magnesium Gluconate 550 MG Tabs Take 30 mg by mouth.   metFORMIN 500 MG tablet Commonly known as:  GLUCOPHAGE TAKE ONE TABLET BY MOUTH TWICE DAILY BEFORE MEAL(S)   omeprazole 20 MG capsule Commonly known as:  PRILOSEC TAKE ONE CAPSULE BY MOUTH TWICE DAILY   potassium chloride 10 MEQ tablet Commonly known as:  K-DUR TAKE ONE TABLET BY MOUTH THREE TIMES DAILY   Vitamin D3 2000 units capsule Take by mouth.       Allergies:  Allergies  Allergen Reactions  . Macrolides And Ketolides Nausea And Vomiting    Azithromycin/erythromycin  . Sulfa Antibiotics Other (See Comments)    Contact dermatitis due to intravaginal preparation    Family History: Family History  Problem Relation Age of Onset  . Breast cancer Maternal Aunt 13  . Breast cancer Maternal Grandmother 34    Social History:  reports that she has been smoking cigarettes.  She has been smoking about 1.00 pack per day. She uses smokeless tobacco. She reports that she does not drink alcohol or use drugs.  Physical Exam: BP 107/69   Pulse 90   Resp 16   Ht 5\' 1"  (1.549 m)   Wt 236 lb 3.2 oz (107.1 kg)   SpO2 98%   BMI 44.63 kg/m   Constitutional:  Alert and oriented, No acute distress. HEENT: [place] AT, moist mucus membranes.  Trachea midline,  no masses. Cardiovascular: No clubbing, cyanosis, or edema. Respiratory: Normal respiratory effort, no increased work of breathing. Skin: No rashes, bruises or suspicious lesions. Neurologic: Grossly intact, no focal deficits, moving all 4 extremities. Psychiatric: Normal mood and affect.  Laboratory Data: Lab Results  Component Value Date   WBC 12.1 (H) 07/04/2017   HGB 13.9 07/04/2017   HCT 42.2 07/04/2017   MCV 87.4 07/04/2017   PLT 192 07/04/2017    Lab Results  Component Value Date   CREATININE 0.70 09/12/2017   Urinalysis Results for orders placed or performed in visit on 09/25/17  Microscopic Examination  Result  Value Ref Range   WBC, UA None seen 0 - 5 /hpf   RBC, UA None seen 0 - 2 /hpf   Epithelial Cells (non renal) >10 (H) 0 - 10 /hpf   Mucus, UA Present (A) Not Estab.   Bacteria, UA Many (A) None seen/Few  Urinalysis, Complete  Result Value Ref Range   Specific Gravity, UA 1.015 1.005 - 1.030   pH, UA 7.5 5.0 - 7.5   Color, UA Yellow Yellow   Appearance Ur Clear Clear   Leukocytes, UA Negative Negative   Protein, UA 1+ (A) Negative/Trace   Glucose, UA Trace (A) Negative   Ketones, UA Negative Negative   RBC, UA Negative Negative   Bilirubin, UA Negative Negative   Urobilinogen, Ur 0.2 0.2 - 1.0 mg/dL   Nitrite, UA Negative Negative   Microscopic Examination See below:     Pertinent Imaging: Results for orders placed during the hospital encounter of 09/12/17  CT HEMATURIA WORKUP   Narrative CLINICAL DATA:  52 year old female with history of intermittent gross hematuria for 1 year. Bladder pressure. Frequent urinary tract infections.  EXAM: CT ABDOMEN AND PELVIS WITHOUT AND WITH CONTRAST  TECHNIQUE: Multidetector CT imaging of the abdomen and pelvis was performed following the standard protocol before and following the bolus administration of intravenous contrast.  CONTRAST:  151mL ISOVUE-300 IOPAMIDOL (ISOVUE-300) INJECTION 61%  COMPARISON:  CT the abdomen and pelvis 07/04/2017.  FINDINGS: Lower chest: Unremarkable.  Hepatobiliary: No suspicious or cystic or solid hepatic lesions are noted. No intra or extrahepatic biliary ductal dilatation. Status post cholecystectomy.  Pancreas: No pancreatic mass. No pancreatic ductal dilatation. No pancreatic or peripancreatic fluid or inflammatory changes.  Spleen: Unremarkable.  Adrenals/Urinary Tract: In the upper pole collecting system of the right kidney there is a 3 mm nonobstructive calculus. No additional calculi are noted within the left renal collecting system, along the course of either ureter, or within the lumen  of the urinary bladder. There is no hydroureteronephrosis or perinephric stranding to suggest urinary tract obstruction at this time. Two tiny subcentimeter low-attenuation lesions are noted in the interpolar region of the left kidney, too small to definitively characterize, but statistically likely to represent tiny cysts. No other suspicious renal lesions are noted. On postcontrast delayed images there are no filling defects within the collecting system of either kidney, along the well opacified portions of either ureter (middle third of the left ureter and majority of the right ureter are incompletely opacified), or in the well opacified portions of the urinary bladder to suggest the presence of urothelial neoplasm at this time.  Stomach/Bowel: Normal appearance of the stomach. No pathologic dilatation of small bowel or colon. Normal appendix.  Vascular/Lymphatic: Aortic atherosclerosis, without evidence of aneurysm or dissection in the abdominal or pelvic vasculature. No lymphadenopathy noted in the abdomen or pelvis.  Reproductive: Status posthysterectomy. Ovaries are not confidently  identified may be surgically absent or atrophic.  Other: No significant volume of ascites.  No pneumoperitoneum.  Musculoskeletal: There are no aggressive appearing lytic or blastic lesions noted in the visualized portions of the skeleton.  IMPRESSION: 1. 3 mm nonobstructive calculus in the upper pole collecting system of the right kidney. No additional ureteral stones or findings of urinary tract obstruction are noted at this time. 2. No other definite source for hematuria is identified. There are 2 subcentimeter low-attenuation lesions in the left kidney which are too small to definitively characterize, but are statistically favored to represent tiny cysts. 3. Aortic atherosclerosis.  Aortic Atherosclerosis (ICD10-I70.0).   Electronically Signed   By: Vinnie Langton M.D.   On:  09/12/2017 12:58    CT urogram reviewed personally and with the patient  Assessment & Plan:    1. Microscopic hematuria Urogram reviewed today with the patient in detail Cystoscopy previously unremarkable No evidence of upper tract lesions other than as per below and as per below  - Urinalysis, Complete  2. Smoker Current smoker We reviewed the causative relationship between tobacco abuse and bladder cancer along with other health detriments Highly encouraged smoking cessation today  3. History of renal cell carcinoma Remote history of renal cell carcinoma 2008 status post partial left nephrectomy No evidence of recurrence Nonspecific subcentimeter   4. Renal lesion Lesions on CT urogram, most likely cyst Would consider renal ultrasound in 2 years to reassess given history of renal cell  Return in about 2 years (around 09/26/2019) for RUS/ UA.  Hollice Espy, MD  [facility] [address] [place], [place] [address] [phone]

## 2017-12-15 ENCOUNTER — Emergency Department
Admission: EM | Admit: 2017-12-15 | Discharge: 2017-12-15 | Disposition: A | Discharge status: Home / Self Care | Payer: Medicaid Other | Attending: Emergency Medicine | Admitting: Emergency Medicine

## 2017-12-15 ENCOUNTER — Encounter: Payer: Self-pay | Admitting: Emergency Medicine

## 2017-12-15 ENCOUNTER — Other Ambulatory Visit: Payer: Self-pay

## 2017-12-15 DIAGNOSIS — Y939 Activity, unspecified: Secondary | ICD-10-CM | POA: Diagnosis not present

## 2017-12-15 DIAGNOSIS — Y92410 Unspecified street and highway as the place of occurrence of the external cause: Secondary | ICD-10-CM | POA: Diagnosis not present

## 2017-12-15 DIAGNOSIS — I10 Essential (primary) hypertension: Secondary | ICD-10-CM | POA: Insufficient documentation

## 2017-12-15 DIAGNOSIS — Y999 Unspecified external cause status: Secondary | ICD-10-CM | POA: Diagnosis not present

## 2017-12-15 DIAGNOSIS — E119 Type 2 diabetes mellitus without complications: Secondary | ICD-10-CM | POA: Insufficient documentation

## 2017-12-15 DIAGNOSIS — Z7984 Long term (current) use of oral hypoglycemic drugs: Secondary | ICD-10-CM | POA: Diagnosis not present

## 2017-12-15 DIAGNOSIS — M7918 Myalgia, other site: Secondary | ICD-10-CM | POA: Diagnosis present

## 2017-12-15 DIAGNOSIS — F17210 Nicotine dependence, cigarettes, uncomplicated: Secondary | ICD-10-CM | POA: Insufficient documentation

## 2017-12-15 DIAGNOSIS — Z79899 Other long term (current) drug therapy: Secondary | ICD-10-CM | POA: Diagnosis not present

## 2017-12-15 MED ORDER — IBUPROFEN 600 MG PO TABS: 600.0000 mg | ORAL_TABLET | Freq: Three times a day (TID) | ORAL | 0 refills | Status: DC | PRN

## 2017-12-15 MED ORDER — TRAMADOL HCL 50 MG PO TABS: 50.0000 mg | ORAL_TABLET | Freq: Two times a day (BID) | ORAL | 0 refills | Status: DC | PRN

## 2017-12-15 MED ORDER — CYCLOBENZAPRINE HCL 10 MG PO TABS: 10.0000 mg | ORAL_TABLET | Freq: Three times a day (TID) | ORAL | 0 refills | Status: DC | PRN
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
## 2018-01-15 ENCOUNTER — Encounter: Payer: Self-pay | Admitting: Certified Registered"

## 2018-01-15 ENCOUNTER — Ambulatory Visit
Admission: RE | Admit: 2018-01-15 | Discharge: 2018-01-15 | Disposition: A | Discharge status: Home / Self Care | Payer: Medicaid Other | Source: Ambulatory Visit | Attending: Gastroenterology | Admitting: Gastroenterology

## 2018-01-15 ENCOUNTER — Encounter
Admission: RE | Disposition: A | Discharge status: Home / Self Care | Payer: Self-pay | Source: Ambulatory Visit | Attending: Gastroenterology

## 2018-01-15 ENCOUNTER — Ambulatory Visit: Payer: Medicaid Other | Admitting: Certified Registered"

## 2018-01-15 DIAGNOSIS — Z6841 Body Mass Index (BMI) 40.0 and over, adult: Secondary | ICD-10-CM | POA: Insufficient documentation

## 2018-01-15 DIAGNOSIS — K219 Gastro-esophageal reflux disease without esophagitis: Secondary | ICD-10-CM | POA: Insufficient documentation

## 2018-01-15 DIAGNOSIS — Z85528 Personal history of other malignant neoplasm of kidney: Secondary | ICD-10-CM | POA: Insufficient documentation

## 2018-01-15 DIAGNOSIS — D127 Benign neoplasm of rectosigmoid junction: Secondary | ICD-10-CM | POA: Insufficient documentation

## 2018-01-15 DIAGNOSIS — Z8371 Family history of colonic polyps: Secondary | ICD-10-CM | POA: Insufficient documentation

## 2018-01-15 DIAGNOSIS — M1990 Unspecified osteoarthritis, unspecified site: Secondary | ICD-10-CM | POA: Insufficient documentation

## 2018-01-15 DIAGNOSIS — E119 Type 2 diabetes mellitus without complications: Secondary | ICD-10-CM | POA: Diagnosis not present

## 2018-01-15 DIAGNOSIS — M109 Gout, unspecified: Secondary | ICD-10-CM | POA: Insufficient documentation

## 2018-01-15 DIAGNOSIS — Z1211 Encounter for screening for malignant neoplasm of colon: Secondary | ICD-10-CM | POA: Diagnosis present

## 2018-01-15 DIAGNOSIS — I10 Essential (primary) hypertension: Secondary | ICD-10-CM | POA: Insufficient documentation

## 2018-01-15 DIAGNOSIS — Z86010 Personal history of colonic polyps: Secondary | ICD-10-CM | POA: Diagnosis not present

## 2018-01-15 DIAGNOSIS — G4730 Sleep apnea, unspecified: Secondary | ICD-10-CM | POA: Diagnosis not present

## 2018-01-15 HISTORY — PX: COLONOSCOPY WITH PROPOFOL: SHX5780

## 2018-01-15 SURGERY — COLONOSCOPY WITH PROPOFOL
Anesthesia: General

## 2018-01-15 MED ORDER — SODIUM CHLORIDE 0.9 % IV SOLN
INTRAVENOUS | Status: DC
  Administered 2018-01-15: 09:00:00 via INTRAVENOUS

## 2018-01-15 MED ORDER — PROPOFOL 500 MG/50ML IV EMUL
INTRAVENOUS | Status: AC
  Filled 2018-01-15: qty 50

## 2018-01-15 MED ORDER — GLYCOPYRROLATE 0.2 MG/ML IJ SOLN
INTRAMUSCULAR | Status: DC | PRN
  Administered 2018-01-15: 0.2 mg via INTRAVENOUS

## 2018-01-15 MED ORDER — PROPOFOL 500 MG/50ML IV EMUL
INTRAVENOUS | Status: DC | PRN
  Administered 2018-01-15: 100 ug/kg/min via INTRAVENOUS

## 2018-01-15 MED ORDER — LIDOCAINE HCL (CARDIAC) PF 100 MG/5ML IV SOSY
PREFILLED_SYRINGE | INTRAVENOUS | Status: DC | PRN
  Administered 2018-01-15: 60 mg via INTRAVENOUS

## 2018-01-15 MED ORDER — LIDOCAINE HCL (PF) 2 % IJ SOLN
INTRAMUSCULAR | Status: AC
  Filled 2018-01-15: qty 10

## 2018-01-15 MED ORDER — PROPOFOL 10 MG/ML IV BOLUS
INTRAVENOUS | Status: DC | PRN
  Administered 2018-01-15 (×2): 100 mg via INTRAVENOUS

## 2018-01-15 MED ORDER — PROPOFOL 10 MG/ML IV BOLUS
INTRAVENOUS | Status: AC
  Filled 2018-01-15: qty 20

## 2018-01-15 MED ORDER — SODIUM CHLORIDE 0.9 % IV SOLN
INTRAVENOUS | Status: DC
  Administered 2018-01-15: 1000 mL via INTRAVENOUS

## 2018-01-15 MED ORDER — GLYCOPYRROLATE 0.2 MG/ML IJ SOLN
INTRAMUSCULAR | Status: AC
  Filled 2018-01-15: qty 1

## 2018-01-15 NOTE — H&P (Signed)
Outpatient short stay form Pre-procedure 01/15/2018 8:40 AM Martha Sails MD  Primary Physician: Emily Filbert the  Reason for visit: Colonoscopy  History of present illness: Patient is a 52 year old female presenting today for colon cancer screening.  There is a family history of colon polyps with her mother.  Her last: Patient's last colonoscopy was 05/13/2008.  At that time there were multiple polyps removed however these were all hyperplastic.  Patient tolerated her prep well.  She takes no aspirin or blood thinning agent.    Current Facility-Administered Medications:  .  0.9 %  sodium chloride infusion, , Intravenous, Continuous, Martha Sails, MD .  0.9 %  sodium chloride infusion, , Intravenous, Continuous, Martha Sails, MD, Last Rate: 20 mL/hr at 01/15/18 0835, 1,000 mL at 01/15/18 0835  Facility-Administered Medications Prior to Admission  Medication Dose Route Frequency Provider Last Rate Last Dose  . levofloxacin (LEVAQUIN) tablet 500 mg  500 mg Oral Once Hollice Espy, MD      . lidocaine (XYLOCAINE) 2 % jelly 1 application  1 application Urethral Once Hollice Espy, MD       Medications Prior to Admission  Medication Sig Dispense Refill Last Dose  . albuterol (PROVENTIL HFA;VENTOLIN HFA) 108 (90 Base) MCG/ACT inhaler Inhale into the lungs every 6 (six) hours as needed for wheezing or shortness of breath.   Past Month at Unknown time  . Cholecalciferol (VITAMIN D3) 2000 units capsule Take by mouth.   Past Week at Unknown time  . cyanocobalamin 2000 MCG tablet Take 2,000 mcg by mouth daily.   Past Week at Unknown time  . cyclobenzaprine (FLEXERIL) 10 MG tablet Take 1 tablet (10 mg total) by mouth 3 (three) times daily as needed. 15 tablet 0 Past Week at Unknown time  . furosemide (LASIX) 10 MG/ML solution Take by mouth.   01/14/2018 at Unknown time  . HYDROcodone-acetaminophen (NORCO/VICODIN) 5-325 MG tablet Take by mouth.   Past Week at Unknown time  . ibuprofen  (ADVIL,MOTRIN) 600 MG tablet Take 1 tablet (600 mg total) by mouth every 8 (eight) hours as needed. 15 tablet 0 Past Week at Unknown time  . lisinopril (PRINIVIL,ZESTRIL) 10 MG tablet Take 10 mg by mouth.   01/14/2018 at Unknown time  . Magnesium Gluconate 550 MG TABS Take 30 mg by mouth.   Past Week at Unknown time  . omeprazole (PRILOSEC) 20 MG capsule TAKE ONE CAPSULE BY MOUTH TWICE DAILY   01/14/2018 at Unknown time  . potassium chloride (K-DUR) 10 MEQ tablet TAKE ONE TABLET BY MOUTH THREE TIMES DAILY   Past Week at Unknown time  . traMADol (ULTRAM) 50 MG tablet Take 1 tablet (50 mg total) by mouth every 12 (twelve) hours as needed. 12 tablet 0 Past Week at Unknown time  . metFORMIN (GLUCOPHAGE) 500 MG tablet TAKE ONE TABLET BY MOUTH TWICE DAILY BEFORE MEAL(S)   Not Taking at Unknown time     Allergies  Allergen Reactions  . Macrolides And Ketolides Nausea And Vomiting    Azithromycin/erythromycin  . Sulfa Antibiotics Other (See Comments)    Contact dermatitis due to intravaginal preparation  . Mirapex [Pramipexole]   . Salmon Oil [Nutritional Supplements]   . Tramadol Hcl   . Zithromax [Azithromycin]      Past Medical History:  Diagnosis Date  . Arthritis   . Diabetes mellitus without complication ([place])   . GERD (gastroesophageal reflux disease)   . Gout   . Hypertension   . Renal cancer (  [place]) 2008  . Sleep apnea     Review of systems:      Physical Exam    Heart and lungs: With and without rub or gallop, lungs are bilaterally clear.    HEENT: Normocephalic atraumatic eyes are anicteric    Other:    Pertinant exam for procedure: Soft nontender nondistended bowel sounds positive normoactive, obese.    Planned proceedures: Colonoscopy and indicated procedures. I have discussed the risks benefits and complications of procedures to include not limited to bleeding, infection, perforation and the risk of sedation and the patient wishes to proceed.    Martha Sails, MD Gastroenterology 01/15/2018  8:40 AM

## 2018-01-15 NOTE — Anesthesia Preprocedure Evaluation (Signed)
Anesthesia Evaluation  Patient identified by MRN, date of birth, ID band Patient awake    Reviewed: Allergy & Precautions, H&P , NPO status , Patient's Chart, lab work & pertinent test results, reviewed documented beta blocker date and time   History of Anesthesia Complications (+) PONV and history of anesthetic complications  Airway Mallampati: III  TM Distance: >3 FB Neck ROM: full    Dental  (+) Edentulous Lower, Dental Advidsory Given, Upper Dentures   Pulmonary neg shortness of breath, sleep apnea , neg COPD, neg recent URI, Current Smoker,           Cardiovascular Exercise Tolerance: Good hypertension, (-) angina(-) CAD, (-) Past MI, (-) Cardiac Stents and (-) CABG (-) dysrhythmias (-) Valvular Problems/Murmurs     Neuro/Psych negative neurological ROS  negative psych ROS   GI/Hepatic Neg liver ROS, GERD  ,  Endo/Other  diabetesMorbid obesity  Renal/GU Renal disease (cancer)  negative genitourinary   Musculoskeletal   Abdominal   Peds  Hematology negative hematology ROS (+)   Anesthesia Other Findings Past Medical History: No date: Arthritis No date: Diabetes mellitus without complication ([facility]) No date: GERD (gastroesophageal reflux disease) No date: Gout No date: Hypertension 2008: Renal cancer ([place]) No date: Sleep apnea   Reproductive/Obstetrics negative OB ROS                             Anesthesia Physical Anesthesia Plan  ASA: III  Anesthesia Plan: General   Post-op Pain Management:    Induction: Intravenous  PONV Risk Score and Plan: 2 and Propofol infusion and TIVA  Airway Management Planned: Nasal Cannula  Additional Equipment:   Intra-op Plan:   Post-operative Plan:   Informed Consent: I have reviewed the patients History and Physical, chart, labs and discussed the procedure including the risks, benefits and alternatives for the proposed anesthesia with  the patient or authorized representative who has indicated his/her understanding and acceptance.   Dental Advisory Given  Plan Discussed with: Anesthesiologist, CRNA and Surgeon  Anesthesia Plan Comments:         Anesthesia Quick Evaluation

## 2018-01-15 NOTE — Anesthesia Post-op Follow-up Note (Signed)
Anesthesia QCDR form completed.        

## 2018-01-15 NOTE — Op Note (Signed)
[facility]astroenter[facility] Patient Name: Martha Hill Procedure Date: 01/15/2018 8:41 AM MRN: [id number] Account #: [id number] Date of Birth: April 14, 1966 Admit Type: Outpatient Age: 52 Room: [facility] ENDO ROOM 3 Gender: Female Note Status: Finalized Procedure:            Colonoscopy Indications:          Family history of colonic polyps in a first-degree                        relative Providers:            Lollie Sails, MD Referring MD:         Rusty Aus, MD (Referring MD) Medicines:            Monitored Anesthesia Care Complications:        No immediate complications. Procedure:            Pre-Anesthesia Assessment:                       - ASA Grade Assessment: III - A patient with severe                        systemic disease.                       After obtaining informed consent, the colonoscope was                        passed under direct vision. Throughout the procedure,                        the patient's blood pressure, pulse, and oxygen                        saturations were monitored continuously. The                        Colonoscope was introduced through the anus and                        advanced to the the ileocolonic anastomosis. The                        colonoscopy was extremely difficult due to poor bowel                        prep with stool present. The patient tolerated the                        procedure well. The quality of the bowel preparation                        was poor. Findings:      Multiple pedunculated and sessile polyps were found in the recto-sigmoid       colon. The polyps were 2 to 8 mm in size. Polypectomy was not attempted       due to inadequate bowel preparation.      The digital rectal exam was normal. Impression:           - Preparation of the colon was poor. Patient will need  a better prep and rescheduled for procedure.                       - Multiple 2 to 8 mm polyps at  the recto-sigmoid colon.                        Resection not attempted.                       - No specimens collected. Recommendation:       reschedule and reprep.                       - Discharge patient to home. Procedure Code(s):    --- Professional ---                       [id number], Colonoscopy, flexible; diagnostic, including                        collection of specimen(s) by brushing or washing, when                        performed (separate procedure) Diagnosis Code(s):    --- Professional ---                       D12.7, Benign neoplasm of rectosigmoid junction                       Z83.71, Family history of colonic polyps CPT copyright 2017 American Medical Association. All rights reserved. The codes documented in this report are preliminary and upon coder review may  be revised to meet current compliance requirements. Lollie Sails, MD 01/15/2018 9:07:53 AM This report has been signed electronically. Number of Addenda: 0 Note Initiated On: 01/15/2018 8:41 AM Total Procedure Duration: 0 hours 5 minutes 10 seconds       [facility]

## 2018-01-15 NOTE — Anesthesia Postprocedure Evaluation (Signed)
Anesthesia Post Note  Patient: Martha Hill  Procedure(s) Performed: COLONOSCOPY WITH PROPOFOL (N/A )  Patient location during evaluation: Endoscopy Anesthesia Type: General Level of consciousness: awake and alert, oriented and patient cooperative Pain management: satisfactory to patient Vital Signs Assessment: post-procedure vital signs reviewed and stable Respiratory status: spontaneous breathing and respiratory function stable Cardiovascular status: blood pressure returned to baseline and stable Postop Assessment: no headache, no backache, patient able to bend at knees, no apparent nausea or vomiting, adequate PO intake and able to ambulate Anesthetic complications: no     Last Vitals:  Vitals:   01/15/18 0815 01/15/18 0900  BP: (!) 149/91 (!) 99/45  Pulse: 85 92  Resp: 20 (!) 22  Temp: (!) 35.9 C (!) 36.2 C  SpO2: 95% 99%    Last Pain:  Vitals:   01/15/18 0900  TempSrc: Tympanic  PainSc:                  Clovis Riley Raul Winterhalter

## 2018-01-15 NOTE — Transfer of Care (Signed)
Immediate Anesthesia Transfer of Care Note  Patient: Martha Hill  Procedure(s) Performed: COLONOSCOPY WITH PROPOFOL (N/A )  Patient Location: PACU and Endoscopy Unit  Anesthesia Type:General  Level of Consciousness: drowsy and patient cooperative  Airway & Oxygen Therapy: Patient Spontanous Breathing  Post-op Assessment: Report given to RN, Post -op Vital signs reviewed and stable and Patient moving all extremities  Post vital signs: Reviewed and stable  Last Vitals:  Vitals Value Taken Time  BP 99/45 01/15/2018  9:04 AM  Temp 36.2 C 01/15/2018  9:00 AM  Pulse 99 01/15/2018  9:05 AM  Resp 17 01/15/2018  9:05 AM  SpO2 91 % 01/15/2018  9:05 AM  Vitals shown include unvalidated device data.  Last Pain:  Vitals:   01/15/18 0900  TempSrc: Tympanic  PainSc:          Complications: No apparent anesthesia complications

## 2018-01-21 ENCOUNTER — Encounter: Payer: Self-pay | Admitting: Gastroenterology

## 2018-02-11 ENCOUNTER — Ambulatory Visit: Payer: Medicaid Other | Admitting: Anesthesiology

## 2018-02-11 ENCOUNTER — Encounter: Payer: Self-pay | Admitting: Anesthesiology

## 2018-02-11 ENCOUNTER — Ambulatory Visit
Admission: RE | Admit: 2018-02-11 | Discharge: 2018-02-11 | Disposition: A | Discharge status: Home / Self Care | Payer: Medicaid Other | Source: Ambulatory Visit | Attending: Gastroenterology | Admitting: Gastroenterology

## 2018-02-11 ENCOUNTER — Encounter
Admission: RE | Disposition: A | Discharge status: Home / Self Care | Payer: Self-pay | Source: Ambulatory Visit | Attending: Gastroenterology

## 2018-02-11 DIAGNOSIS — G4730 Sleep apnea, unspecified: Secondary | ICD-10-CM | POA: Diagnosis not present

## 2018-02-11 DIAGNOSIS — M109 Gout, unspecified: Secondary | ICD-10-CM | POA: Insufficient documentation

## 2018-02-11 DIAGNOSIS — Z8371 Family history of colonic polyps: Secondary | ICD-10-CM | POA: Diagnosis not present

## 2018-02-11 DIAGNOSIS — K6289 Other specified diseases of anus and rectum: Secondary | ICD-10-CM | POA: Insufficient documentation

## 2018-02-11 DIAGNOSIS — Z7984 Long term (current) use of oral hypoglycemic drugs: Secondary | ICD-10-CM | POA: Diagnosis not present

## 2018-02-11 DIAGNOSIS — Z1211 Encounter for screening for malignant neoplasm of colon: Secondary | ICD-10-CM | POA: Diagnosis not present

## 2018-02-11 DIAGNOSIS — K635 Polyp of colon: Secondary | ICD-10-CM | POA: Diagnosis not present

## 2018-02-11 DIAGNOSIS — Z881 Allergy status to other antibiotic agents status: Secondary | ICD-10-CM | POA: Diagnosis not present

## 2018-02-11 DIAGNOSIS — E119 Type 2 diabetes mellitus without complications: Secondary | ICD-10-CM | POA: Diagnosis not present

## 2018-02-11 DIAGNOSIS — Z888 Allergy status to other drugs, medicaments and biological substances status: Secondary | ICD-10-CM | POA: Insufficient documentation

## 2018-02-11 DIAGNOSIS — Z79899 Other long term (current) drug therapy: Secondary | ICD-10-CM | POA: Diagnosis not present

## 2018-02-11 DIAGNOSIS — K5140 Inflammatory polyps of colon without complications: Secondary | ICD-10-CM | POA: Insufficient documentation

## 2018-02-11 DIAGNOSIS — Z9989 Dependence on other enabling machines and devices: Secondary | ICD-10-CM | POA: Diagnosis not present

## 2018-02-11 DIAGNOSIS — M1990 Unspecified osteoarthritis, unspecified site: Secondary | ICD-10-CM | POA: Insufficient documentation

## 2018-02-11 DIAGNOSIS — Z85528 Personal history of other malignant neoplasm of kidney: Secondary | ICD-10-CM | POA: Insufficient documentation

## 2018-02-11 DIAGNOSIS — I10 Essential (primary) hypertension: Secondary | ICD-10-CM | POA: Insufficient documentation

## 2018-02-11 DIAGNOSIS — K219 Gastro-esophageal reflux disease without esophagitis: Secondary | ICD-10-CM | POA: Insufficient documentation

## 2018-02-11 DIAGNOSIS — Z882 Allergy status to sulfonamides status: Secondary | ICD-10-CM | POA: Diagnosis not present

## 2018-02-11 DIAGNOSIS — Z885 Allergy status to narcotic agent status: Secondary | ICD-10-CM | POA: Insufficient documentation

## 2018-02-11 DIAGNOSIS — K529 Noninfective gastroenteritis and colitis, unspecified: Secondary | ICD-10-CM | POA: Diagnosis not present

## 2018-02-11 HISTORY — PX: COLONOSCOPY WITH PROPOFOL: SHX5780

## 2018-02-11 LAB — GLUCOSE, CAPILLARY: Glucose-Capillary: 138 mg/dL — ABNORMAL HIGH (ref 70–99)

## 2018-02-11 SURGERY — COLONOSCOPY WITH PROPOFOL
Anesthesia: General

## 2018-02-11 MED ORDER — MIDAZOLAM HCL 2 MG/2ML IJ SOLN
INTRAMUSCULAR | Status: AC
  Filled 2018-02-11: qty 2

## 2018-02-11 MED ORDER — FENTANYL CITRATE (PF) 100 MCG/2ML IJ SOLN
INTRAMUSCULAR | Status: DC | PRN
  Administered 2018-02-11: 50 ug via INTRAVENOUS
  Administered 2018-02-11 (×2): 25 ug via INTRAVENOUS

## 2018-02-11 MED ORDER — PROPOFOL 500 MG/50ML IV EMUL
INTRAVENOUS | Status: AC
  Filled 2018-02-11: qty 50

## 2018-02-11 MED ORDER — LIDOCAINE HCL (CARDIAC) PF 100 MG/5ML IV SOSY
PREFILLED_SYRINGE | INTRAVENOUS | Status: DC | PRN
  Administered 2018-02-11: 30 mg via INTRAVENOUS

## 2018-02-11 MED ORDER — PROPOFOL 500 MG/50ML IV EMUL
INTRAVENOUS | Status: DC | PRN
  Administered 2018-02-11: 120 ug/kg/min via INTRAVENOUS

## 2018-02-11 MED ORDER — SODIUM CHLORIDE 0.9 % IV SOLN
INTRAVENOUS | Status: DC
  Administered 2018-02-11: 09:00:00 via INTRAVENOUS

## 2018-02-11 MED ORDER — FENTANYL CITRATE (PF) 100 MCG/2ML IJ SOLN
INTRAMUSCULAR | Status: AC
  Filled 2018-02-11: qty 2

## 2018-02-11 MED ORDER — LIDOCAINE HCL (PF) 2 % IJ SOLN
INTRAMUSCULAR | Status: AC
  Filled 2018-02-11: qty 10

## 2018-02-11 MED ORDER — SPOT INK MARKER SYRINGE KIT
PACK | SUBMUCOSAL | Status: DC | PRN
  Administered 2018-02-11: 3 mL via SUBMUCOSAL

## 2018-02-11 MED ORDER — MIDAZOLAM HCL 2 MG/2ML IJ SOLN
INTRAMUSCULAR | Status: DC | PRN
  Administered 2018-02-11: 2 mg via INTRAVENOUS

## 2018-02-11 NOTE — H&P (Signed)
Outpatient short stay form Pre-procedure 02/11/2018 8:39 AM Lollie Sails MD  Primary Physician: Emily Filbert, MD  Reason for visit: Colonoscopy  History of present illness: Patient is a 52-year-old female presenting today as above.  There is a family history of colon polyps in her mother.  Patient herself has had a colonoscopy in the past which showed hyperplastic polyps only.  She tolerated her prep well.  She takes no aspirin or blood thinning agent.   No current facility-administered medications for this encounter.   Facility-Administered Medications Prior to Admission  Medication Dose Route Frequency Provider Last Rate Last Dose  . levofloxacin (LEVAQUIN) tablet 500 mg  500 mg Oral Once Hollice Espy, MD      . lidocaine (XYLOCAINE) 2 % jelly 1 application  1 application Urethral Once Hollice Espy, MD       Medications Prior to Admission  Medication Sig Dispense Refill Last Dose  . albuterol (PROVENTIL HFA;VENTOLIN HFA) 108 (90 Base) MCG/ACT inhaler Inhale into the lungs every 6 (six) hours as needed for wheezing or shortness of breath.   Past Week at Unknown time  . Cholecalciferol (VITAMIN D3) 2000 units capsule Take by mouth.   Past Week at Unknown time  . cyanocobalamin 2000 MCG tablet Take 2,000 mcg by mouth daily.   Past Week at Unknown time  . furosemide (LASIX) 10 MG/ML solution Take by mouth.   02/10/2018 at Unknown time  . HYDROcodone-acetaminophen (NORCO/VICODIN) 5-325 MG tablet Take by mouth.   Past Week at Unknown time  . lisinopril (PRINIVIL,ZESTRIL) 10 MG tablet Take 10 mg by mouth.   02/10/2018 at Unknown time  . Magnesium Gluconate 550 MG TABS Take 30 mg by mouth.   Past Week at Unknown time  . metFORMIN (GLUCOPHAGE) 500 MG tablet TAKE ONE TABLET BY MOUTH TWICE DAILY BEFORE MEAL(S)   Past Week at Unknown time  . omeprazole (PRILOSEC) 20 MG capsule TAKE ONE CAPSULE BY MOUTH TWICE DAILY   02/10/2018 at Unknown time  . potassium chloride (K-DUR) 10 MEQ tablet TAKE ONE  TABLET BY MOUTH THREE TIMES DAILY   Past Week at Unknown time  . cyclobenzaprine (FLEXERIL) 10 MG tablet Take 1 tablet (10 mg total) by mouth 3 (three) times daily as needed. (Patient not taking: Reported on 02/11/2018) 15 tablet 0 Not Taking at Unknown time  . ibuprofen (ADVIL,MOTRIN) 600 MG tablet Take 1 tablet (600 mg total) by mouth every 8 (eight) hours as needed. (Patient not taking: Reported on 02/11/2018) 15 tablet 0 Not Taking at Unknown time  . traMADol (ULTRAM) 50 MG tablet Take 1 tablet (50 mg total) by mouth every 12 (twelve) hours as needed. (Patient not taking: Reported on 02/11/2018) 12 tablet 0 Not Taking at Unknown time     Allergies  Allergen Reactions  . Eucalyptus Flavor [Flavoring Agent] Shortness Of Breath  . Macrolides And Ketolides Nausea And Vomiting    Azithromycin/erythromycin  . Sulfa Antibiotics Other (See Comments)    Contact dermatitis due to intravaginal preparation  . Mirapex [Pramipexole]   . Salmon Oil [Nutritional Supplements]   . Tramadol Hcl   . Zithromax [Azithromycin]      Past Medical History:  Diagnosis Date  . Arthritis   . Diabetes mellitus without complication ([place])   . GERD (gastroesophageal reflux disease)   . Gout   . Hypertension   . Renal cancer ([place]) 2008  . Sleep apnea     Review of systems:      Physical Exam  Heart and lungs: Regular rate and rhythm without rub or gallop, lungs are bilaterally clear.    HEENT: Normal cephalic atraumatic eyes are    Other:    Pertinant exam for procedure: Soft nontender nondistended bowel sounds positive normoactive.  protuberant.    Planned proceedures: Anoscopy and indicated procedures. I have discussed the risks benefits and complications of procedures to include not limited to bleeding, infection, perforation and the risk of sedation and the patient wishes to proceed.    Lollie Sails, MD Gastroenterology 02/11/2018  8:39 AM

## 2018-02-11 NOTE — Transfer of Care (Signed)
Immediate Anesthesia Transfer of Care Note  Patient: Martha Hill  Procedure(s) Performed: COLONOSCOPY WITH PROPOFOL (N/A )  Patient Location: PACU  Anesthesia Type:General  Level of Consciousness: awake and sedated  Airway & Oxygen Therapy: Patient Spontanous Breathing and Patient connected to face mask oxygen  Post-op Assessment: Report given to RN and Post -op Vital signs reviewed and stable  Post vital signs: Reviewed and stable  Last Vitals:  Vitals Value Taken Time  BP    Temp    Pulse    Resp    SpO2      Last Pain:  Vitals:   02/11/18 0817  TempSrc: Tympanic  PainSc: 0-No pain         Complications: No apparent anesthesia complications

## 2018-02-11 NOTE — Anesthesia Preprocedure Evaluation (Signed)
Anesthesia Evaluation  Patient identified by MRN, date of birth, ID band Patient awake    Reviewed: Allergy & Precautions, H&P , NPO status , Patient's Chart, lab work & pertinent test results, reviewed documented beta blocker date and time   Airway Mallampati: II   Neck ROM: full    Dental  (+) Poor Dentition   Pulmonary neg pulmonary ROS, sleep apnea and Continuous Positive Airway Pressure Ventilation , Current Smoker,    Pulmonary exam normal        Cardiovascular Exercise Tolerance: Poor hypertension, On Medications negative cardio ROS Normal cardiovascular exam Rhythm:regular Rate:Normal     Neuro/Psych negative neurological ROS  negative psych ROS   GI/Hepatic negative GI ROS, Neg liver ROS, GERD  Medicated,  Endo/Other  negative endocrine ROSdiabetes  Renal/GU Renal diseasenegative Renal ROS  negative genitourinary   Musculoskeletal   Abdominal   Peds  Hematology negative hematology ROS (+)   Anesthesia Other Findings Past Medical History: No date: Arthritis No date: Diabetes mellitus without complication ([facility]) No date: GERD (gastroesophageal reflux disease) No date: Gout No date: Hypertension 2008: Renal cancer ([place]) No date: Sleep apnea Past Surgical History: No date: ABDOMINAL HYSTERECTOMY No date: CHOLECYSTECTOMY 01/15/2018: COLONOSCOPY WITH PROPOFOL; N/A     Comment:  Procedure: COLONOSCOPY WITH PROPOFOL;  Surgeon:               Lollie Sails, MD;  Location: [facility] ENDOSCOPY;                Service: Endoscopy;  Laterality: N/A; 2008: ROBOTIC ASSITED PARTIAL NEPHRECTOMY; Left No date: TONSILLECTOMY BMI    Body Mass Index:  43.46 kg/m     Reproductive/Obstetrics negative OB ROS                             Anesthesia Physical Anesthesia Plan  ASA: III  Anesthesia Plan: General   Post-op Pain Management:    Induction:   PONV Risk Score and Plan:   Airway  Management Planned:   Additional Equipment:   Intra-op Plan:   Post-operative Plan:   Informed Consent: I have reviewed the patients History and Physical, chart, labs and discussed the procedure including the risks, benefits and alternatives for the proposed anesthesia with the patient or authorized representative who has indicated his/her understanding and acceptance.   Dental Advisory Given  Plan Discussed with: CRNA  Anesthesia Plan Comments:         Anesthesia Quick Evaluation

## 2018-02-11 NOTE — Anesthesia Postprocedure Evaluation (Signed)
Anesthesia Post Note  Patient: Martha Hill  Procedure(s) Performed: COLONOSCOPY WITH PROPOFOL (N/A )  Patient location during evaluation: PACU Anesthesia Type: General Level of consciousness: awake and alert Pain management: pain level controlled Vital Signs Assessment: post-procedure vital signs reviewed and stable Respiratory status: spontaneous breathing, nonlabored ventilation, respiratory function stable and patient connected to nasal cannula oxygen Cardiovascular status: blood pressure returned to baseline and stable Postop Assessment: no apparent nausea or vomiting Anesthetic complications: no     Last Vitals:  Vitals:   02/11/18 0955 02/11/18 1005  BP: 102/61 119/88  Pulse: (!) 104 83  Resp: 18 14  Temp: (!) 36.1 C   SpO2: 99% 100%    Last Pain:  Vitals:   02/11/18 1005  TempSrc:   PainSc: 0-No pain                 Molli Barrows

## 2018-02-11 NOTE — Op Note (Signed)
[facility] Gastroenterology Patient Name: Martha Hill Procedure Date: 02/11/2018 8:22 AM MRN: [id number] Account #: [id number] Date of Birth: 1965/07/10 Admit Type: Outpatient Age: 52 Room: [facility] ENDO ROOM 3 Gender: Female Note Status: Finalized Procedure:            Colonoscopy Indications:          Family history of colonic polyps in a first-degree                        relative Providers:            Lollie Sails, MD Referring MD:         Rusty Aus, MD (Referring MD) Medicines:            Monitored Anesthesia Care Complications:        No immediate complications. Procedure:            Pre-Anesthesia Assessment:                       - ASA Grade Assessment: III - A patient with severe                        systemic disease.                       After obtaining informed consent, the colonoscope was                        passed under direct vision. Throughout the procedure,                        the patient's blood pressure, pulse, and oxygen                        saturations were monitored continuously. The was                        introduced through the anus and advanced to the the                        cecum, identified by appendiceal orifice and ileocecal                        valve. The colonoscopy was performed with moderate                        difficulty due to significant looping. Successful                        completion of the procedure was aided by using manual                        pressure. Findings:      A 3 mm polyp was found in the cecum. The polyp was sessile. The polyp       was removed with a cold biopsy forceps. Resection and retrieval were       complete.      A 2 mm polyp was found in the ileocecal valve. The polyp was sessile.       The polyp was removed with a cold biopsy forceps. Resection and  retrieval were complete.      A 2 mm polyp was found in the mid sigmoid colon. The polyp was sessile.   The polyp was removed with a cold biopsy forceps. Resection and       retrieval were complete.      A 2 mm polyp was found in the distal sigmoid colon. The polyp was       sessile. The polyp was removed with a cold biopsy forceps. Resection and       retrieval were complete.      A 8 mm polyp was found in the recto-sigmoid junction colon. The polyp       was semi-pedunculated. The polyp was removed with a hot snare. Resection       and retrieval were complete. Area was tattooed with an injection of 3 mL       of [place] ink.      Separate cold biopsies were taken of the base of the fold where the       polyp was located with a cold forcep.      Four sessile polyps were found in the rectum. The polyps were 2 to 4 mm       in size. These polyps were removed with a cold snare. Resection and       retrieval were complete.      The digital rectal exam was normal. Retroflex was not done due to       patient not holding insufflation, multiple passes radial showed no other       lesions. Impression:           - One 3 mm polyp in the cecum, removed with a cold                        biopsy forceps. Resected and retrieved.                       - One 2 mm polyp at the ileocecal valve, removed with a                        cold biopsy forceps. Resected and retrieved.                       - One 2 mm polyp in the mid sigmoid colon, removed with                        a cold biopsy forceps. Resected and retrieved.                       - One 2 mm polyp in the distal sigmoid colon, removed                        with a cold biopsy forceps. Resected and retrieved.                       - One 8 mm polyp at the recto-sigmoid colon, removed                        with a hot snare. Resected and retrieved. Tattooed. Recommendation:       - Await pathology results.                       -  may need to repeat procedure in 3 months depending on                        histology. Procedure Code(s):    ---  Professional ---                       [phone], Colonoscopy, flexible; with removal of tumor(s),                        polyp(s), or other lesion(s) by snare technique                       [id number], 17, Colonoscopy, flexible; with biopsy, single                        or multiple                       [id number], Colonoscopy, flexible; with directed submucosal                        injection(s), any substance Diagnosis Code(s):    --- Professional ---                       D12.0, Benign neoplasm of cecum                       D12.5, Benign neoplasm of sigmoid colon                       D12.7, Benign neoplasm of rectosigmoid junction                       Z83.71, Family history of colonic polyps CPT copyright 2017 American Medical Association. All rights reserved. The codes documented in this report are preliminary and upon coder review may  be revised to meet current compliance requirements. Lollie Sails, MD 02/11/2018 9:59:25 AM This report has been signed electronically. Number of Addenda: 0 Note Initiated On: 02/11/2018 8:22 AM Scope Withdrawal Time: 0 hours 39 minutes 14 seconds  Total Procedure Duration: 0 hours 48 minutes 14 seconds       [facility]

## 2018-02-11 NOTE — Anesthesia Procedure Notes (Signed)
Performed by: Vaughan Sine Pre-anesthesia Checklist: Patient identified, Emergency Drugs available, Suction available, Patient being monitored and Timeout performed Patient Re-evaluated:Patient Re-evaluated prior to induction Oxygen Delivery Method: Simple face mask Preoxygenation: Pre-oxygenation with 100% oxygen Induction Type: IV induction Ventilation: Nasal airway inserted- appropriate to patient size Placement Confirmation: positive ETCO2 and CO2 detector Dental Injury: Teeth and Oropharynx as per pre-operative assessment  Difficulty Due To: Difficult Airway- due to limited oral opening, Difficult Airway- due to reduced neck mobility and Difficult Airway- due to large tongue

## 2018-02-11 NOTE — Anesthesia Post-op Follow-up Note (Signed)
Anesthesia QCDR form completed.        

## 2018-02-12 ENCOUNTER — Encounter: Payer: Self-pay | Admitting: Gastroenterology

## 2018-02-12 LAB — SURGICAL PATHOLOGY

## 2018-07-03 ENCOUNTER — Emergency Department: Payer: Self-pay

## 2018-07-03 ENCOUNTER — Other Ambulatory Visit: Payer: Self-pay

## 2018-07-03 ENCOUNTER — Emergency Department
Admission: EM | Admit: 2018-07-03 | Discharge: 2018-07-03 | Disposition: A | Discharge status: Home / Self Care | Payer: Self-pay | Attending: Emergency Medicine | Admitting: Emergency Medicine

## 2018-07-03 ENCOUNTER — Encounter: Payer: Self-pay | Admitting: Emergency Medicine

## 2018-07-03 DIAGNOSIS — M5441 Lumbago with sciatica, right side: Secondary | ICD-10-CM | POA: Insufficient documentation

## 2018-07-03 DIAGNOSIS — F17210 Nicotine dependence, cigarettes, uncomplicated: Secondary | ICD-10-CM | POA: Insufficient documentation

## 2018-07-03 DIAGNOSIS — Z79899 Other long term (current) drug therapy: Secondary | ICD-10-CM | POA: Insufficient documentation

## 2018-07-03 DIAGNOSIS — Z85528 Personal history of other malignant neoplasm of kidney: Secondary | ICD-10-CM | POA: Insufficient documentation

## 2018-07-03 DIAGNOSIS — E119 Type 2 diabetes mellitus without complications: Secondary | ICD-10-CM | POA: Insufficient documentation

## 2018-07-03 DIAGNOSIS — I10 Essential (primary) hypertension: Secondary | ICD-10-CM | POA: Insufficient documentation

## 2018-07-03 MED ORDER — MORPHINE SULFATE (PF) 4 MG/ML IV SOLN
4.0000 mg | Freq: Once | INTRAVENOUS | Status: AC
  Administered 2018-07-03: 4 mg via INTRAVENOUS
  Filled 2018-07-03: qty 1

## 2018-07-03 MED ORDER — DEXAMETHASONE SODIUM PHOSPHATE 10 MG/ML IJ SOLN
10.0000 mg | Freq: Once | INTRAMUSCULAR | Status: AC
  Administered 2018-07-03: 10 mg via INTRAVENOUS
  Filled 2018-07-03: qty 1

## 2018-07-03 MED ORDER — METHYLPREDNISOLONE 4 MG PO TBPK: ORAL_TABLET | ORAL | 0 refills | Status: DC

## 2018-07-03 MED ORDER — ORPHENADRINE CITRATE 30 MG/ML IJ SOLN
60.0000 mg | Freq: Two times a day (BID) | INTRAMUSCULAR | Status: DC
  Administered 2018-07-03: 60 mg via INTRAVENOUS
  Filled 2018-07-03: qty 2

## 2018-07-03 MED ORDER — HYDROMORPHONE HCL 1 MG/ML IJ SOLN
1.0000 mg | Freq: Once | INTRAMUSCULAR | Status: AC
  Administered 2018-07-03: 1 mg via INTRAVENOUS
  Filled 2018-07-03: qty 1

## 2018-07-03 MED ORDER — ONDANSETRON HCL 4 MG/2ML IJ SOLN
4.0000 mg | Freq: Once | INTRAMUSCULAR | Status: AC
  Administered 2018-07-03: 4 mg via INTRAVENOUS
  Filled 2018-07-03: qty 2

## 2018-07-03 NOTE — Discharge Instructions (Addendum)
Follow-up with your regular doctor if not better in 3 to 5 days.  Return emergency department worsening.  Use the medication as prescribed.  Apply ice to lower back.

## 2018-07-03 NOTE — ED Notes (Signed)
Reference triage note. Pt c/o 10/10 pain on right side that radiates down to right leg. Pt able to move right leg, 2+ pulses noted and full sensation noted to affected extremity.

## 2018-07-03 NOTE — ED Provider Notes (Signed)
[facility] Emergency Department Provider Note  ____________________________________________   First MD Initiated Contact with Patient 07/03/18 1555     (approximate)  I have reviewed the triage vital signs and the nursing notes.   HISTORY  Chief Complaint Back Pain    HPI Martha Hill is a 53 y.o. female  C/o low back pain for 2 day, no known injury, pain is worse with movement, increased with bending over, denies numbness, tingling, or changes in bowel/urinary habits,  Using otc meds and prescription meds without relief.  Pain radiates from the right hip into the groin and right thigh.  She states she has history of restless leg syndrome and takes hydrocodone for this.  She states this is not helped her back pain.  She states it severe and she cannot get comfortable. Remainder ros neg   Past Medical History:  Diagnosis Date  . Arthritis   . Diabetes mellitus without complication ([place])   . GERD (gastroesophageal reflux disease)   . Gout   . Hypertension   . Renal cancer ([place]) 2008  . Sleep apnea     There are no active problems to display for this patient.   Past Surgical History:  Procedure Laterality Date  . ABDOMINAL HYSTERECTOMY    . CHOLECYSTECTOMY    . COLONOSCOPY WITH PROPOFOL N/A 01/15/2018   Procedure: COLONOSCOPY WITH PROPOFOL;  Surgeon: Lollie Sails, MD;  Location: [facility] ENDOSCOPY;  Service: Endoscopy;  Laterality: N/A;  . COLONOSCOPY WITH PROPOFOL N/A 02/11/2018   Procedure: COLONOSCOPY WITH PROPOFOL;  Surgeon: Lollie Sails, MD;  Location: [facility] ENDOSCOPY;  Service: Endoscopy;  Laterality: N/A;  . ROBOTIC ASSITED PARTIAL NEPHRECTOMY Left 2008  . TONSILLECTOMY      Prior to Admission medications   Medication Sig Start Date End Date Taking? Authorizing Provider  albuterol (PROVENTIL HFA;VENTOLIN HFA) 108 (90 Base) MCG/ACT inhaler Inhale into the lungs every 6 (six) hours as needed for wheezing or shortness of breath.     [provider]  Cholecalciferol (VITAMIN D3) 2000 units capsule Take by mouth.    [provider]  cyanocobalamin 2000 MCG tablet Take 2,000 mcg by mouth daily.    [provider]  cyclobenzaprine (FLEXERIL) 10 MG tablet Take 1 tablet (10 mg total) by mouth 3 (three) times daily as needed. Patient not taking: Reported on 02/11/2018 12/15/17   Sable Feil, PA-C  furosemide (LASIX) 10 MG/ML solution Take by mouth.    [provider]  HYDROcodone-acetaminophen (NORCO/VICODIN) 5-325 MG tablet Take by mouth. 06/25/17   [provider]  ibuprofen (ADVIL,MOTRIN) 600 MG tablet Take 1 tablet (600 mg total) by mouth every 8 (eight) hours as needed. Patient not taking: Reported on 02/11/2018 12/15/17   Sable Feil, PA-C  lisinopril (PRINIVIL,ZESTRIL) 10 MG tablet Take 10 mg by mouth.    [provider]  Magnesium Gluconate 550 MG TABS Take 30 mg by mouth.    [provider]  metFORMIN (GLUCOPHAGE) 500 MG tablet TAKE ONE TABLET BY MOUTH TWICE DAILY BEFORE MEAL(S) 02/12/17   [provider]  methylPREDNISolone (MEDROL DOSEPAK) 4 MG TBPK tablet Take 6 pills on day one then decrease by 1 pill each day 07/03/18   Versie Starks, PA-C  omeprazole (PRILOSEC) 20 MG capsule TAKE ONE CAPSULE BY MOUTH TWICE DAILY 02/12/17   [provider]  potassium chloride (K-DUR) 10 MEQ tablet TAKE ONE TABLET BY MOUTH THREE TIMES DAILY 08/23/16   [provider]  traMADol (ULTRAM) 50 MG tablet Take 1 tablet (50 mg total) by mouth every 12 (twelve) hours as needed. Patient not taking: Reported on 02/11/2018 12/15/17   Sable Feil, PA-C    Allergies Eucalyptus flavor [flavoring agent]; Macrolides and ketolides; Sulfa antibiotics; Mirapex [pramipexole]; Salmon oil [nutritional supplements]; Tramadol hcl; and Zithromax [azithromycin]  Family History  Problem Relation Age of Onset  . Breast cancer Maternal Aunt 66  . Breast cancer Maternal  Grandmother 70    Social History Social History   Tobacco Use  . Smoking status: Current Every Day Smoker    Packs/day: 1.00    Types: Cigarettes  . Smokeless tobacco: Never Used  Substance Use Topics  . Alcohol use: No  . Drug use: No    Review of Systems  Constitutional: No fever/chills Eyes: No visual changes. ENT: No sore throat. Respiratory: Denies cough Genitourinary: Negative for dysuria. Musculoskeletal: Positive for back pain. Skin: Negative for rash.    ____________________________________________   PHYSICAL EXAM:  VITAL SIGNS: ED Triage Vitals  Enc Vitals Group     BP 07/03/18 1420 127/63     Pulse Rate 07/03/18 1420 93     Resp --      Temp 07/03/18 1420 98.1 F (36.7 C)     Temp Source 07/03/18 1420 Oral     SpO2 07/03/18 1420 98 %     Weight 07/03/18 1420 234 lb (106.1 kg)     Height 07/03/18 1420 5\' 1"  (1.549 m)     Head Circumference --      Peak Flow --      Pain Score 07/03/18 1434 8     Pain Loc --      Pain Edu? --      Excl. in [place]? --     Constitutional: Alert and oriented. Well appearing and in no acute distress. Eyes: Conjunctivae are normal.  Head: Atraumatic. Nose: No congestion/rhinnorhea. Mouth/Throat: Mucous membranes are moist.   Neck:  supple no lymphadenopathy noted Cardiovascular: Normal rate, regular rhythm. Heart sounds are normal Respiratory: Normal respiratory effort.  No retractions, lungs c t a  Abd: soft nontender bs normal all 4 quad GU: deferred Musculoskeletal: FROM all extremities, warm and well perfused.  Decreased rom of back due to discomfort, lumbar spine minimally tender, unable to assess Slr, full strength in great toes b/l, full strength in lower legs, n/v intact, the right thigh is minimally tender Neurologic:  Normal speech and language.  Skin:  Skin is warm, dry and intact. No rash noted. Psychiatric: Mood and affect are normal. Speech and behavior are  normal.  ____________________________________________   LABS (all labs ordered are listed, but only abnormal results are displayed)  Labs Reviewed - No data to display ____________________________________________   ____________________________________________  RADIOLOGY  X-ray lumbar spine is negative for any acute abnormality  ____________________________________________   PROCEDURES  Procedure(s) performed: Saline lock, morphine 4 mg IV, Norflex 60 mg IV, Zofran 4 mg IV on recheck of the patient she is refusing lumbar x-rays she is stating the medications did not help her.  She was given Dilaudid 1 mg IV and Decadron 10 mg IV.  Procedures    ____________________________________________   INITIAL IMPRESSION / ASSESSMENT AND PLAN / ED COURSE  Pertinent labs & imaging results that were available during my care of the patient were reviewed by me and considered in my medical decision making (see chart for details).   Patient is a 53 year old female presents emergency department complaining of  low back pain on the right side that radiates to the right leg.  History of chronic problems with restless leg and takes hydrocodone for this.  She is unsure if she is on a pain contract with Dr. Sabra Heck.  Physical exam shows that the patient is uncomfortable.  The lower back is minimally tender, the right leg is a little tender.  The exam findings are basically very vague.  Remainder is unremarkable.  Patient was given saline lock, morphine 4 mg IV, Zofran 4 mg IV and Norflex 60 mg IV  X-ray entered the room to take her for a lumbar x-ray if she is refusing x-ray at this time patient states she is not out of pain.  I went to assess patient she states that she does not think that she got any pain medication.  I assured her that the chart states that the nursing staff gave her the pain medication when they inserted her IV.  She states "well it did not help ". Told her I could give her  additional pain medication but last week did need to get the x-ray.  She was given Dilaudid 1 mg IV and Decadron 10 mg IV.  I told her this would be the extent of the pain medication she would be getting from the ED.    ----------------------------------------- 7:44 PM on 07/03/2018 -----------------------------------------  Patient states she had much relief with the medication.  Explained to her that the lumbar x-ray is negative.  She will be discharged with a Medrol Dosepak.  She is apply ice to the lower back.  Return if worsening.  Follow-up with your regular doctor as needed.  As part of my medical decision making, I reviewed the following data within the [place] notes reviewed and incorporated, Old chart reviewed, Radiograph reviewed x-ray lumbar spine is negative, Notes from prior ED visits and [place] Controlled Substance Database  ____________________________________________   FINAL CLINICAL IMPRESSION(S) / ED DIAGNOSES  Final diagnoses:  Acute midline low back pain with right-sided sciatica      NEW MEDICATIONS STARTED DURING THIS VISIT:  New Prescriptions   METHYLPREDNISOLONE (MEDROL DOSEPAK) 4 MG TBPK TABLET    Take 6 pills on day one then decrease by 1 pill each day     Note:  This document was prepared using Dragon voice recognition software and may include unintentional dictation errors.     Versie Starks, PA-C 07/03/18 1944    Schaevitz, Randall An, MD 07/03/18 [phone]

## 2018-07-03 NOTE — ED Triage Notes (Signed)
PT arrives with complaints of lower right back pain. Pt reports chronic back/side pain but states this episode is "different" and feels stronger. Pt states the pain flare started yesterday but pt has had no relief with her prescribed medication. PT denies known injury, no loss of bowel or urinary ability. No urinary complaints.

## 2019-01-23 ENCOUNTER — Emergency Department: Payer: Self-pay

## 2019-01-23 ENCOUNTER — Encounter: Payer: Self-pay | Admitting: Emergency Medicine

## 2019-01-23 ENCOUNTER — Emergency Department
Admission: EM | Admit: 2019-01-23 | Discharge: 2019-01-23 | Disposition: A | Discharge status: Home / Self Care | Payer: Self-pay | Attending: Emergency Medicine | Admitting: Emergency Medicine

## 2019-01-23 ENCOUNTER — Other Ambulatory Visit: Payer: Self-pay

## 2019-01-23 DIAGNOSIS — N10 Acute tubulo-interstitial nephritis: Secondary | ICD-10-CM | POA: Insufficient documentation

## 2019-01-23 DIAGNOSIS — Z7984 Long term (current) use of oral hypoglycemic drugs: Secondary | ICD-10-CM | POA: Insufficient documentation

## 2019-01-23 DIAGNOSIS — Z85528 Personal history of other malignant neoplasm of kidney: Secondary | ICD-10-CM | POA: Insufficient documentation

## 2019-01-23 DIAGNOSIS — Z888 Allergy status to other drugs, medicaments and biological substances status: Secondary | ICD-10-CM | POA: Insufficient documentation

## 2019-01-23 DIAGNOSIS — Z882 Allergy status to sulfonamides status: Secondary | ICD-10-CM | POA: Insufficient documentation

## 2019-01-23 DIAGNOSIS — Z881 Allergy status to other antibiotic agents status: Secondary | ICD-10-CM | POA: Insufficient documentation

## 2019-01-23 DIAGNOSIS — Z91018 Allergy to other foods: Secondary | ICD-10-CM | POA: Insufficient documentation

## 2019-01-23 DIAGNOSIS — I10 Essential (primary) hypertension: Secondary | ICD-10-CM | POA: Insufficient documentation

## 2019-01-23 DIAGNOSIS — N12 Tubulo-interstitial nephritis, not specified as acute or chronic: Secondary | ICD-10-CM

## 2019-01-23 DIAGNOSIS — F17210 Nicotine dependence, cigarettes, uncomplicated: Secondary | ICD-10-CM | POA: Insufficient documentation

## 2019-01-23 DIAGNOSIS — Z79899 Other long term (current) drug therapy: Secondary | ICD-10-CM | POA: Insufficient documentation

## 2019-01-23 DIAGNOSIS — E119 Type 2 diabetes mellitus without complications: Secondary | ICD-10-CM | POA: Insufficient documentation

## 2019-01-23 LAB — URINALYSIS, ROUTINE W REFLEX MICROSCOPIC
Bilirubin Urine: NEGATIVE
Glucose, UA: 50 mg/dL — AB
Ketones, ur: NEGATIVE mg/dL
Nitrite: POSITIVE — AB
Protein, ur: 100 mg/dL — AB
RBC / HPF: >50 RBC/hpf — ABNORMAL HIGH (ref 0–5)
Specific Gravity, Urine: 1.013 (ref 1.005–1.030)
WBC, UA: >50 WBC/hpf — ABNORMAL HIGH (ref 0–5)
pH: 8 (ref 5.0–8.0)

## 2019-01-23 LAB — CBC WITH DIFFERENTIAL/PLATELET
Abs Immature Granulocytes: 0.04 10*3/uL (ref 0.00–0.07)
Basophils Absolute: 0 10*3/uL (ref 0.0–0.1)
Basophils Relative: 0 %
Eosinophils Absolute: 0.1 10*3/uL (ref 0.0–0.5)
Eosinophils Relative: 1 %
HCT: 40.8 % (ref 36.0–46.0)
Hemoglobin: 12.8 g/dL (ref 12.0–15.0)
Immature Granulocytes: 0 %
Lymphocytes Relative: 18 %
Lymphs Abs: 2.2 10*3/uL (ref 0.7–4.0)
MCH: 27.6 pg (ref 26.0–34.0)
MCHC: 31.4 g/dL (ref 30.0–36.0)
MCV: 88.1 fL (ref 80.0–100.0)
Monocytes Absolute: 1.6 10*3/uL — ABNORMAL HIGH (ref 0.1–1.0)
Monocytes Relative: 13 %
Neutro Abs: 8.3 10*3/uL — ABNORMAL HIGH (ref 1.7–7.7)
Neutrophils Relative %: 68 %
Platelets: 176 10*3/uL (ref 150–400)
RBC: 4.63 MIL/uL (ref 3.87–5.11)
RDW: 15.6 % — ABNORMAL HIGH (ref 11.5–15.5)
WBC: 12.3 10*3/uL — ABNORMAL HIGH (ref 4.0–10.5)
nRBC: 0 % (ref 0.0–0.2)

## 2019-01-23 LAB — BASIC METABOLIC PANEL
Anion gap: 6 (ref 5–15)
BUN: 9 mg/dL (ref 6–20)
CO2: 29 mmol/L (ref 22–32)
Calcium: 8.8 mg/dL — ABNORMAL LOW (ref 8.9–10.3)
Chloride: 102 mmol/L (ref 98–111)
Creatinine, Ser: 0.87 mg/dL (ref 0.44–1.00)
GFR calc Af Amer: >60 mL/min (ref >60)
GFR calc non Af Amer: >60 mL/min (ref >60)
Glucose, Bld: 184 mg/dL — ABNORMAL HIGH (ref 70–99)
Potassium: 3.4 mmol/L — ABNORMAL LOW (ref 3.5–5.1)
Sodium: 137 mmol/L (ref 135–145)

## 2019-01-23 MED ORDER — CIPROFLOXACIN HCL 500 MG PO TABS: 500.0000 mg | ORAL_TABLET | Freq: Two times a day (BID) | ORAL | 0 refills | Status: AC

## 2019-01-23 MED ORDER — SODIUM CHLORIDE 0.9 % IV SOLN
1.0000 g | Freq: Once | INTRAVENOUS | Status: AC
  Administered 2019-01-23: 1 g via INTRAVENOUS
  Filled 2019-01-23: qty 10

## 2019-01-23 MED ORDER — ONDANSETRON HCL 4 MG PO TABS: 4.0000 mg | ORAL_TABLET | Freq: Every day | ORAL | 0 refills | Status: DC | PRN

## 2019-01-23 NOTE — ED Provider Notes (Signed)
[facility] Emergency Department Provider Note  ____________________________________________   First MD Initiated Contact with Patient 01/23/19 1455     (approximate)  I have reviewed the triage vital signs and the nursing notes.   HISTORY  Chief Complaint Urinary Tract Infection    HPI Martha Hill is a 53 y.o. female with diabetes, hypertension, renal cancer who presents with concern for UTI.  Patient endorses having some dysuria for the past 5 weeks however the past 1 week she developed right flank pain that is intermittent, radiates into her groin, severe, nothing makes it better, nothing makes it worse.  She denies any vomiting.  Denies any fevers.  Denies any history of kidney stones.  She has had prior renal cancer but still has her kidney just to part of the kidney out.    Past Medical History:  Diagnosis Date   Arthritis    Diabetes mellitus without complication ([place])    GERD (gastroesophageal reflux disease)    Gout    Hypertension    Renal cancer ([place]) 2008   Sleep apnea     There are no active problems to display for this patient.   Past Surgical History:  Procedure Laterality Date   ABDOMINAL HYSTERECTOMY     CHOLECYSTECTOMY     COLONOSCOPY WITH PROPOFOL N/A 01/15/2018   Procedure: COLONOSCOPY WITH PROPOFOL;  Surgeon: Lollie Sails, MD;  Location[facility] ENDOSCOPY;  Service: Endoscopy;  Laterality: N/A;   COLONOSCOPY WITH PROPOFOL N/A 02/11/2018   Procedure: COLONOSCOPY WITH PROPOFOL;  Surgeon: Lollie Sails, MD;  Location: [facility] ENDOSCOPY;  Service: Endoscopy;  Laterality: N/A;   ROBOTIC ASSITED PARTIAL NEPHRECTOMY Left 2008   TONSILLECTOMY      Prior to Admission medications   Medication Sig Start Date End Date Taking? Authorizing Provider  albuterol (PROVENTIL HFA;VENTOLIN HFA) 108 (90 Base) MCG/ACT inhaler Inhale into the lungs every 6 (six) hours as needed for wheezing or shortness of breath.     [provider]  Cholecalciferol (VITAMIN D3) 2000 units capsule Take by mouth.    [provider]  cyanocobalamin 2000 MCG tablet Take 2,000 mcg by mouth daily.    [provider]  cyclobenzaprine (FLEXERIL) 10 MG tablet Take 1 tablet (10 mg total) by mouth 3 (three) times daily as needed. Patient not taking: Reported on 02/11/2018 12/15/17   Sable Feil, PA-C  furosemide (LASIX) 10 MG/ML solution Take by mouth.    [provider]  HYDROcodone-acetaminophen (NORCO/VICODIN) 5-325 MG tablet Take by mouth. 06/25/17   [provider]  ibuprofen (ADVIL,MOTRIN) 600 MG tablet Take 1 tablet (600 mg total) by mouth every 8 (eight) hours as needed. Patient not taking: Reported on 02/11/2018 12/15/17   Sable Feil, PA-C  lisinopril (PRINIVIL,ZESTRIL) 10 MG tablet Take 10 mg by mouth.    [provider]  Magnesium Gluconate 550 MG TABS Take 30 mg by mouth.    [provider]  metFORMIN (GLUCOPHAGE) 500 MG tablet TAKE ONE TABLET BY MOUTH TWICE DAILY BEFORE MEAL(S) 02/12/17   [provider]  methylPREDNISolone (MEDROL DOSEPAK) 4 MG TBPK tablet Take 6 pills on day one then decrease by 1 pill each day 07/03/18   Versie Starks, PA-C  omeprazole (PRILOSEC) 20 MG capsule TAKE ONE CAPSULE BY MOUTH TWICE DAILY 02/12/17   [provider]  potassium chloride (K-DUR) 10 MEQ tablet TAKE ONE TABLET BY MOUTH THREE TIMES DAILY 08/23/16   [provider]  traMADol Veatrice Bourbon) 50  MG tablet Take 1 tablet (50 mg total) by mouth every 12 (twelve) hours as needed. Patient not taking: Reported on 02/11/2018 12/15/17   Sable Feil, PA-C    Allergies Eucalyptus flavor [flavoring agent], Macrolides and ketolides, Sulfa antibiotics, Mirapex [pramipexole], Salmon oil [nutritional supplements], Tramadol hcl, and Zithromax [azithromycin]  Family History  Problem Relation Age of Onset   Breast cancer Maternal Aunt 50   Breast cancer Maternal  Grandmother 70    Social History Social History   Tobacco Use   Smoking status: Current Every Day Smoker    Packs/day: 1.00    Types: Cigarettes   Smokeless tobacco: Never Used  Substance Use Topics   Alcohol use: No   Drug use: No      Review of Systems Constitutional: No fever/chills Eyes: No visual changes. ENT: No sore throat. Cardiovascular: Denies chest pain. Respiratory: Denies shortness of breath. Gastrointestinal: No abdominal pain.  No nausea, no vomiting.  No diarrhea.  No constipation. Genitourinary: Positive dysuria Musculoskeletal: Positive flank pain Skin: Negative for rash. Neurological: Negative for headaches, focal weakness or numbness. All other ROS negative ____________________________________________   PHYSICAL EXAM:  VITAL SIGNS: ED Triage Vitals  Enc Vitals Group     BP 01/23/19 1418 (!) 120/57     Pulse Rate 01/23/19 1418 (!) 102     Resp 01/23/19 1418 16     Temp 01/23/19 1418 98.7 F (37.1 C)     Temp Source 01/23/19 1418 Oral     SpO2 01/23/19 1418 99 %     Weight 01/23/19 1419 240 lb (108.9 kg)     Height 01/23/19 1419 5\' 1"  (1.549 m)     Head Circumference --      Peak Flow --      Pain Score 01/23/19 1419 8     Pain Loc --      Pain Edu? --      Excl. in [place]? --     Constitutional: Alert and oriented. Well appearing and in no acute distress. Eyes: Conjunctivae are normal. EOMI. Head: Atraumatic. Nose: No congestion/rhinnorhea. Mouth/Throat: Mucous membranes are moist.   Neck: No stridor. Trachea Midline. FROM Cardiovascular: Tachycardic, regular rhythm. Grossly normal heart sounds.  Good peripheral circulation. Respiratory: Normal respiratory effort.  No retractions. Lungs CTAB. Gastrointestinal: Soft and nontender. No distention. No abdominal bruits.  Musculoskeletal: No lower extremity tenderness nor edema.  No joint effusions. Neurologic:  Normal speech and language. No gross focal neurologic deficits are  appreciated.  Skin:  Skin is warm, dry and intact. No rash noted. Psychiatric: Mood and affect are normal. Speech and behavior are normal. Back: Right flank tenderness  ____________________________________________   LABS (all labs ordered are listed, but only abnormal results are displayed)  Labs Reviewed  URINALYSIS, ROUTINE W REFLEX MICROSCOPIC - Abnormal; Notable for the following components:      Result Value   Color, Urine YELLOW (*)    APPearance CLOUDY (*)    Glucose, UA 50 (*)    Hgb urine dipstick LARGE (*)    Protein, ur 100 (*)    Nitrite POSITIVE (*)    Leukocytes,Ua LARGE (*)    RBC / HPF >50 (*)    WBC, UA >50 (*)    Bacteria, UA MANY (*)    All other components within normal limits  CBC WITH DIFFERENTIAL/PLATELET - Abnormal; Notable for the following components:   WBC 12.3 (*)    RDW 15.6 (*)    Neutro Abs 8.3 (*)  Monocytes Absolute 1.6 (*)    All other components within normal limits  BASIC METABOLIC PANEL - Abnormal; Notable for the following components:   Potassium 3.4 (*)    Glucose, Bld 184 (*)    Calcium 8.8 (*)    All other components within normal limits   ____________________________________________  RADIOLOGY Robert Bellow, personally viewed and evaluated these images (plain radiographs) as part of my medical decision making, as well as reviewing the written report by the radiologist.   Official radiology report(s): Ct Renal Stone Study  Result Date: 01/23/2019 CLINICAL DATA:  Severe low back and flank pain radiating more 2 right-side similar to previous UTIs. EXAM: CT ABDOMEN AND PELVIS WITHOUT CONTRAST TECHNIQUE: Multidetector CT imaging of the abdomen and pelvis was performed following the standard protocol without IV contrast. COMPARISON:  09/12/2017 and 07/04/2017 FINDINGS: Lower chest: Lungs are clear. Hepatobiliary: Previous cholecystectomy. Liver and biliary tree are normal. Pancreas: Normal. Spleen: Normal. Adrenals/Urinary Tract:  Adrenal glands are normal. Kidneys are normal in size. Subtle calcification over the periphery of the lateral left mid pole cortex likely due to patient's previous partial nephrectomy. Possible punctate stone over the upper pole right kidney. Mild dilatation of the right intrarenal collecting system and ureter. Minimal stranding of the adjacent fat near the proximal proximal to mid right ureter. Left ureter is normal. No ureteral or bladder stones identified. Subtle ill definition of the bladder wall with subtle hazy attenuation of the adjacent fat. Findings may be due to cystitis. Stomach/Bowel: Stomach and small bowel are normal. Appendix is normal. Colon is normal. Vascular/Lymphatic: Moderate calcified plaque over the abdominal aorta. No adenopathy. Reproductive: Previous hysterectomy.  Ovaries are normal. Other: No free fluid. Musculoskeletal: Unremarkable. IMPRESSION: Ill definition of the bladder wall with mild prominence of the right intrarenal collecting system and ureter as well as subtle stranding of the fat adjacent the proximal to mid right ureter. Findings are likely due to recent passage of a stone versus infection. Single punctate nonobstructing upper pole right renal stone. Postoperative changes over the left renal midpole. Aortic Atherosclerosis (ICD10-I70.0). Electronically Signed   By: Marin Olp M.D.   On: 01/23/2019 15:44    ____________________________________________   PROCEDURES  Procedure(s) performed (including Critical Care):  Procedures   ____________________________________________   INITIAL IMPRESSION / ASSESSMENT AND PLAN / ED COURSE  Martha Hill was evaluated in Emergency Department on 01/23/2019 for the symptoms described in the history of present illness. She was evaluated in the context of the global COVID-19 pandemic, which necessitated consideration that the patient might be at risk for infection with the SARS-CoV-2 virus that causes COVID-19.  Institutional protocols and algorithms that pertain to the evaluation of patients at risk for COVID-19 are in a state of rapid change based on information released by regulatory bodies including the CDC and federal and state organizations. These policies and algorithms were followed during the patient's care in the ED.     Patient story is concerning for UTI that is now become pyelonephritis.  However given the RBCs in her urine as well as the intermittent right flank pain will get CT scan to rule out obstructing kidney stone.  Patient has no lower abdominal pain to suggest appendicitis.  She is no vomiting or decrease in stool to suggest SBO.    White count slightly elevated 12.3.  Creatinine is normal.  Urine is consistent with UTI.  CT scan does not show any evidence of retained kidney stone.  Possibly she passed  a kidney stone versus infection.  Given this we will treat patient as pyelonephritis.  Also give a course of nausea medicine.  Patient looks well here is tolerating p.o.  She feels comfortable with being discharged on antibiotics.  ____________________________________________   FINAL CLINICAL IMPRESSION(S) / ED DIAGNOSES   Final diagnoses:  Pyelonephritis      MEDICATIONS GIVEN DURING THIS VISIT:  Medications  cefTRIAXone (ROCEPHIN) 1 g in sodium chloride 0.9 % 100 mL IVPB (1 g Intravenous New Bag/Given 01/23/19 1554)     ED Discharge Orders         Ordered    ciprofloxacin (CIPRO) 500 MG tablet  2 times daily     01/23/19 1628    ondansetron (ZOFRAN) 4 MG tablet  Daily PRN     01/23/19 1628           Note:  This document was prepared using Dragon voice recognition software and may include unintentional dictation errors.   Vanessa Providence, MD 01/23/19 [phone]

## 2019-01-23 NOTE — ED Notes (Signed)
No fevers noted. Pt states having severe lower back/flank pain that radiates more to the right side. States that it feels similar to previous UTI's.

## 2019-01-23 NOTE — Discharge Instructions (Addendum)
Give you antibiotics with ciprofloxacin to help treat pyelonephritis.  We have also prescribed Zofran to help with any nausea.  Return to ER if you develop worsening pain, unable to tolerate eating or any other concerns   Ill definition of the bladder wall with mild prominence of the right intrarenal collecting system and ureter as well as subtle stranding of the fat adjacent the proximal to mid right ureter. Findings are likely due to recent passage of a stone versus infection.   Single punctate nonobstructing upper pole right renal stone. Postoperative changes over the left renal midpole.   Aortic Atherosclerosis (ICD10-I70.0).

## 2019-01-23 NOTE — ED Triage Notes (Signed)
Pt here with c/o uti for about 5 weeks now, has not been on any antibiotics, right flank pain today, pain with urination. NAD.

## 2019-03-23 ENCOUNTER — Other Ambulatory Visit: Payer: Self-pay

## 2019-03-23 ENCOUNTER — Inpatient Hospital Stay
Admission: EM | Admit: 2019-03-23 | Discharge: 2019-03-25 | DRG: 872 | Disposition: A | Discharge status: Home / Self Care | Payer: Medicaid Other | Attending: Specialist | Admitting: Specialist

## 2019-03-23 ENCOUNTER — Emergency Department: Payer: Medicaid Other

## 2019-03-23 DIAGNOSIS — Z888 Allergy status to other drugs, medicaments and biological substances status: Secondary | ICD-10-CM

## 2019-03-23 DIAGNOSIS — R3129 Other microscopic hematuria: Secondary | ICD-10-CM

## 2019-03-23 DIAGNOSIS — Z20828 Contact with and (suspected) exposure to other viral communicable diseases: Secondary | ICD-10-CM | POA: Diagnosis present

## 2019-03-23 DIAGNOSIS — N3289 Other specified disorders of bladder: Secondary | ICD-10-CM | POA: Diagnosis present

## 2019-03-23 DIAGNOSIS — Z905 Acquired absence of kidney: Secondary | ICD-10-CM

## 2019-03-23 DIAGNOSIS — G4730 Sleep apnea, unspecified: Secondary | ICD-10-CM | POA: Diagnosis present

## 2019-03-23 DIAGNOSIS — E119 Type 2 diabetes mellitus without complications: Secondary | ICD-10-CM | POA: Diagnosis present

## 2019-03-23 DIAGNOSIS — Z803 Family history of malignant neoplasm of breast: Secondary | ICD-10-CM

## 2019-03-23 DIAGNOSIS — Z882 Allergy status to sulfonamides status: Secondary | ICD-10-CM

## 2019-03-23 DIAGNOSIS — Z9089 Acquired absence of other organs: Secondary | ICD-10-CM

## 2019-03-23 DIAGNOSIS — Z79891 Long term (current) use of opiate analgesic: Secondary | ICD-10-CM

## 2019-03-23 DIAGNOSIS — K219 Gastro-esophageal reflux disease without esophagitis: Secondary | ICD-10-CM | POA: Diagnosis present

## 2019-03-23 DIAGNOSIS — Z90710 Acquired absence of both cervix and uterus: Secondary | ICD-10-CM

## 2019-03-23 DIAGNOSIS — Z9049 Acquired absence of other specified parts of digestive tract: Secondary | ICD-10-CM

## 2019-03-23 DIAGNOSIS — Z85528 Personal history of other malignant neoplasm of kidney: Secondary | ICD-10-CM

## 2019-03-23 DIAGNOSIS — N12 Tubulo-interstitial nephritis, not specified as acute or chronic: Secondary | ICD-10-CM

## 2019-03-23 DIAGNOSIS — I10 Essential (primary) hypertension: Secondary | ICD-10-CM | POA: Diagnosis present

## 2019-03-23 DIAGNOSIS — Z7984 Long term (current) use of oral hypoglycemic drugs: Secondary | ICD-10-CM

## 2019-03-23 DIAGNOSIS — E860 Dehydration: Secondary | ICD-10-CM | POA: Diagnosis present

## 2019-03-23 DIAGNOSIS — G2581 Restless legs syndrome: Secondary | ICD-10-CM | POA: Diagnosis present

## 2019-03-23 DIAGNOSIS — M1990 Unspecified osteoarthritis, unspecified site: Secondary | ICD-10-CM | POA: Diagnosis present

## 2019-03-23 DIAGNOSIS — Z79899 Other long term (current) drug therapy: Secondary | ICD-10-CM

## 2019-03-23 DIAGNOSIS — Z885 Allergy status to narcotic agent status: Secondary | ICD-10-CM

## 2019-03-23 DIAGNOSIS — A4151 Sepsis due to Escherichia coli [E. coli]: Principal | ICD-10-CM | POA: Diagnosis present

## 2019-03-23 DIAGNOSIS — Z881 Allergy status to other antibiotic agents status: Secondary | ICD-10-CM

## 2019-03-23 DIAGNOSIS — F17210 Nicotine dependence, cigarettes, uncomplicated: Secondary | ICD-10-CM | POA: Diagnosis present

## 2019-03-23 DIAGNOSIS — N10 Acute tubulo-interstitial nephritis: Secondary | ICD-10-CM | POA: Diagnosis present

## 2019-03-23 DIAGNOSIS — A419 Sepsis, unspecified organism: Secondary | ICD-10-CM | POA: Diagnosis present

## 2019-03-23 LAB — URINALYSIS, COMPLETE (UACMP) WITH MICROSCOPIC
Bilirubin Urine: NEGATIVE
Glucose, UA: 50 mg/dL — AB
Ketones, ur: NEGATIVE mg/dL
Nitrite: POSITIVE — AB
Protein, ur: 100 mg/dL — AB
RBC / HPF: >50 RBC/hpf — ABNORMAL HIGH (ref 0–5)
Specific Gravity, Urine: 1.01 (ref 1.005–1.030)
WBC, UA: >50 WBC/hpf — ABNORMAL HIGH (ref 0–5)
pH: 7 (ref 5.0–8.0)

## 2019-03-23 LAB — CBC WITH DIFFERENTIAL/PLATELET
Abs Immature Granulocytes: 0.03 10*3/uL (ref 0.00–0.07)
Basophils Absolute: 0.1 10*3/uL (ref 0.0–0.1)
Basophils Relative: 1 %
Eosinophils Absolute: 0.1 10*3/uL (ref 0.0–0.5)
Eosinophils Relative: 1 %
HCT: 40.2 % (ref 36.0–46.0)
Hemoglobin: 12.9 g/dL (ref 12.0–15.0)
Immature Granulocytes: 0 %
Lymphocytes Relative: 14 %
Lymphs Abs: 1.6 10*3/uL (ref 0.7–4.0)
MCH: 28.1 pg (ref 26.0–34.0)
MCHC: 32.1 g/dL (ref 30.0–36.0)
MCV: 87.6 fL (ref 80.0–100.0)
Monocytes Absolute: 1.5 10*3/uL — ABNORMAL HIGH (ref 0.1–1.0)
Monocytes Relative: 13 %
Neutro Abs: 8.3 10*3/uL — ABNORMAL HIGH (ref 1.7–7.7)
Neutrophils Relative %: 71 %
Platelets: 172 10*3/uL (ref 150–400)
RBC: 4.59 MIL/uL (ref 3.87–5.11)
RDW: 14.8 % (ref 11.5–15.5)
WBC: 11.6 10*3/uL — ABNORMAL HIGH (ref 4.0–10.5)
nRBC: 0 % (ref 0.0–0.2)

## 2019-03-23 LAB — PROTIME-INR
INR: 1.1 (ref 0.8–1.2)
Prothrombin Time: 14.2 seconds (ref 11.4–15.2)

## 2019-03-23 LAB — COMPREHENSIVE METABOLIC PANEL
ALT: 24 U/L (ref 0–44)
AST: 30 U/L (ref 15–41)
Albumin: 3.6 g/dL (ref 3.5–5.0)
Alkaline Phosphatase: 56 U/L (ref 38–126)
Anion gap: 8 (ref 5–15)
BUN: 13 mg/dL (ref 6–20)
CO2: 28 mmol/L (ref 22–32)
Calcium: 9 mg/dL (ref 8.9–10.3)
Chloride: 101 mmol/L (ref 98–111)
Creatinine, Ser: 1.06 mg/dL — ABNORMAL HIGH (ref 0.44–1.00)
GFR calc Af Amer: >60 mL/min (ref >60)
GFR calc non Af Amer: 59 mL/min — ABNORMAL LOW (ref >60)
Glucose, Bld: 218 mg/dL — ABNORMAL HIGH (ref 70–99)
Potassium: 4.7 mmol/L (ref 3.5–5.1)
Sodium: 137 mmol/L (ref 135–145)
Total Bilirubin: 1.1 mg/dL (ref 0.3–1.2)
Total Protein: 7.6 g/dL (ref 6.5–8.1)

## 2019-03-23 LAB — GLUCOSE, CAPILLARY
Glucose-Capillary: 159 mg/dL — ABNORMAL HIGH (ref 70–99)
Glucose-Capillary: 151 mg/dL — ABNORMAL HIGH (ref 70–99)

## 2019-03-23 LAB — LACTIC ACID, PLASMA
Lactic Acid, Venous: 1.2 mmol/L (ref 0.5–1.9)
Lactic Acid, Venous: 1 mmol/L (ref 0.5–1.9)

## 2019-03-23 LAB — APTT: aPTT: 35 seconds (ref 24–36)

## 2019-03-23 LAB — HIV ANTIBODY (ROUTINE TESTING W REFLEX): HIV Screen 4th Generation wRfx: NONREACTIVE

## 2019-03-23 LAB — SARS CORONAVIRUS 2 (TAT 6-24 HRS): SARS Coronavirus 2: NEGATIVE

## 2019-03-23 MED ORDER — SENNOSIDES-DOCUSATE SODIUM 8.6-50 MG PO TABS: 1.0000 | ORAL_TABLET | Freq: Every evening | ORAL | Status: DC | PRN

## 2019-03-23 MED ORDER — ACETAMINOPHEN 325 MG PO TABS
650.0000 mg | ORAL_TABLET | Freq: Four times a day (QID) | ORAL | Status: DC | PRN
  Administered 2019-03-24: 650 mg via ORAL
  Filled 2019-03-23: qty 2

## 2019-03-23 MED ORDER — PANTOPRAZOLE SODIUM 40 MG PO TBEC
40.0000 mg | DELAYED_RELEASE_TABLET | Freq: Every day | ORAL | Status: DC
  Administered 2019-03-23 – 2019-03-25 (×3): 40 mg via ORAL
  Filled 2019-03-23 (×3): qty 1

## 2019-03-23 MED ORDER — SODIUM CHLORIDE 0.9 % IV SOLN
1.0000 g | INTRAVENOUS | Status: DC
  Administered 2019-03-24: 1 g via INTRAVENOUS
  Filled 2019-03-23: qty 1
  Filled 2019-03-23: qty 10

## 2019-03-23 MED ORDER — SODIUM CHLORIDE 0.9 % IV SOLN
INTRAVENOUS | Status: DC
  Administered 2019-03-23 – 2019-03-24 (×3): via INTRAVENOUS

## 2019-03-23 MED ORDER — METFORMIN HCL 500 MG PO TABS
500.0000 mg | ORAL_TABLET | Freq: Two times a day (BID) | ORAL | Status: DC
  Filled 2019-03-23 (×2): qty 1

## 2019-03-23 MED ORDER — ENOXAPARIN SODIUM 40 MG/0.4ML ~~LOC~~ SOLN
40.0000 mg | Freq: Two times a day (BID) | SUBCUTANEOUS | Status: DC
  Administered 2019-03-23 – 2019-03-25 (×4): 40 mg via SUBCUTANEOUS
  Filled 2019-03-23 (×4): qty 0.4

## 2019-03-23 MED ORDER — SODIUM CHLORIDE 0.9 % IV BOLUS
1000.0000 mL | Freq: Once | INTRAVENOUS | Status: AC
  Administered 2019-03-23: 1000 mL via INTRAVENOUS

## 2019-03-23 MED ORDER — SODIUM CHLORIDE 0.9 % IV SOLN
1.0000 g | Freq: Once | INTRAVENOUS | Status: AC
  Administered 2019-03-23: 1 g via INTRAVENOUS
  Filled 2019-03-23: qty 10

## 2019-03-23 MED ORDER — ONDANSETRON HCL 4 MG/2ML IJ SOLN
4.0000 mg | Freq: Four times a day (QID) | INTRAMUSCULAR | Status: DC | PRN
  Administered 2019-03-24: 4 mg via INTRAVENOUS
  Filled 2019-03-23: qty 2

## 2019-03-23 MED ORDER — ENOXAPARIN SODIUM 40 MG/0.4ML ~~LOC~~ SOLN: 40.0000 mg | SUBCUTANEOUS | Status: DC

## 2019-03-23 MED ORDER — VITAMIN B-12 1000 MCG PO TABS
2000.0000 ug | ORAL_TABLET | Freq: Every day | ORAL | Status: DC
  Administered 2019-03-23 – 2019-03-25 (×3): 2000 ug via ORAL
  Filled 2019-03-23 (×3): qty 2

## 2019-03-23 MED ORDER — INSULIN ASPART 100 UNIT/ML ~~LOC~~ SOLN: 0.0000 [IU] | Freq: Every day | SUBCUTANEOUS | Status: DC

## 2019-03-23 MED ORDER — LISINOPRIL 10 MG PO TABS
10.0000 mg | ORAL_TABLET | Freq: Every day | ORAL | Status: DC
  Administered 2019-03-25: 10 mg via ORAL
  Filled 2019-03-23 (×2): qty 1

## 2019-03-23 MED ORDER — ALBUTEROL SULFATE (2.5 MG/3ML) 0.083% IN NEBU: 2.5000 mg | INHALATION_SOLUTION | Freq: Four times a day (QID) | RESPIRATORY_TRACT | Status: DC | PRN

## 2019-03-23 MED ORDER — ACETAMINOPHEN 650 MG RE SUPP: 650.0000 mg | Freq: Four times a day (QID) | RECTAL | Status: DC | PRN

## 2019-03-23 MED ORDER — INSULIN ASPART 100 UNIT/ML ~~LOC~~ SOLN
0.0000 [IU] | Freq: Three times a day (TID) | SUBCUTANEOUS | Status: DC
  Administered 2019-03-23 – 2019-03-24 (×2): 3 [IU] via SUBCUTANEOUS
  Filled 2019-03-23 (×2): qty 1

## 2019-03-23 MED ORDER — MORPHINE SULFATE (PF) 2 MG/ML IV SOLN
2.0000 mg | Freq: Once | INTRAVENOUS | Status: AC
  Administered 2019-03-23: 11:00:00 2 mg via INTRAVENOUS
  Filled 2019-03-23: qty 1

## 2019-03-23 MED ORDER — HYDROCODONE-ACETAMINOPHEN 5-325 MG PO TABS
1.0000 | ORAL_TABLET | ORAL | Status: DC | PRN
  Administered 2019-03-24 – 2019-03-25 (×4): 2 via ORAL
  Filled 2019-03-23 (×4): qty 2

## 2019-03-23 MED ORDER — ONDANSETRON HCL 4 MG PO TABS: 4.0000 mg | ORAL_TABLET | Freq: Four times a day (QID) | ORAL | Status: DC | PRN

## 2019-03-23 MED ORDER — ONDANSETRON HCL 4 MG/2ML IJ SOLN
4.0000 mg | Freq: Once | INTRAMUSCULAR | Status: AC
  Administered 2019-03-23: 4 mg via INTRAVENOUS
  Filled 2019-03-23: qty 2

## 2019-03-23 NOTE — ED Notes (Signed)
Rolling call given by this RN to [place].

## 2019-03-23 NOTE — Progress Notes (Signed)
CODE SEPSIS - PHARMACY COMMUNICATION  **Broad Spectrum Antibiotics should be administered within 1 hour of Sepsis diagnosis**  Time Code Sepsis Called/Page Received: 1211  Antibiotics Ordered: ceftriaxone  Time of 1st antibiotic administration: 1041   Additional action taken by pharmacy: n/a If necessary, Name of Provider/Nurse Contacted: n/a    Rocky Morel ,PharmD Clinical Pharmacist  03/23/2019  12:18 PM

## 2019-03-23 NOTE — Progress Notes (Signed)
Advanced care plan.  Purpose of the Encounter: CODE STATUS  Parties in Attendance: Patient  Patient's Decision Capacity: Good  Subjective/Patient's story:  53 y.o. female with a known history of type 2 diabetes mellitus, GERD, gout, hypertension, renal cancer, sleep apnea, arthritis presented to the emergency room for flank pain.  Patient had low-grade fever and chills.  Flank pain is aching in nature 6 out of 10 on a scale of 1-10.  Patient was also hypotensive in the emergency room she was bolused with IV fluids and started on IV antibiotics.  Urinalysis revealed infection.  COVID-19 test pending.  Renal ultrasound did not show any kidney stones.   Objective/Medical story Patient needs IV fluids urine culture and further work-up along with IV antibiotics   Goals of care determination:  Advance care directives goals of care treatment plan discussed Patient wants everything done which includes CPR, intubation ventilator if the need arises   CODE STATUS: Full code   Time spent discussing advanced care planning: 16 minutes

## 2019-03-23 NOTE — Progress Notes (Signed)
PHARMACIST - PHYSICIAN COMMUNICATION  CONCERNING:  Enoxaparin (Lovenox) for DVT Prophylaxis    RECOMMENDATION: Patient was prescribed enoxaprin 40mg  q24 hours for VTE prophylaxis.   Filed Weights   03/23/19 0955  Weight: 240 lb (108.9 kg)    Body mass index is 45.35 kg/m.  Estimated Creatinine Clearance: 68.3 mL/min (A) (by C-G formula based on SCr of 1.06 mg/dL (H)).   Based on [place] patient is candidate for enoxaparin 40mg  every 12 hour dosing due to BMI being >40.  DESCRIPTION: Pharmacy has adjusted enoxaparin dose per [facility] policy.  Patient is now receiving enoxaparin 40mg  every 12 hours.   Alajia Schmelzer, PharmD Clinical Pharmacist  03/23/2019 2:06 PM

## 2019-03-23 NOTE — H&P (Signed)
[facility] [place] NAME: Martha Hill    MR#:  [id number]  DATE OF BIRTH:  December 15, 1965  DATE OF ADMISSION:  03/23/2019  PRIMARY CARE PHYSICIAN: Rusty Aus, MD   REQUESTING/REFERRING PHYSICIAN:   CHIEF COMPLAINT:   Chief Complaint  Patient presents with  . Flank Pain    HISTORY OF PRESENT ILLNESS: Martha Hill  is a 53 y.o. female with a known history of type 2 diabetes mellitus, GERD, gout, hypertension, renal cancer, sleep apnea, arthritis presented to the emergency room for flank pain.  Patient had low-grade fever and chills.  Flank pain is aching in nature 6 out of 10 on a scale of 1-10.  Patient was also hypotensive in the emergency room she was bolused with IV fluids and started on IV antibiotics.  Urinalysis revealed infection.  COVID-19 test pending.  Renal ultrasound did not show any kidney stones.  PAST MEDICAL HISTORY:   Past Medical History:  Diagnosis Date  . Arthritis   . Diabetes mellitus without complication ([place])   . GERD (gastroesophageal reflux disease)   . Gout   . Hypertension   . Renal cancer ([place]) 2008  . Sleep apnea     PAST SURGICAL HISTORY:  Past Surgical History:  Procedure Laterality Date  . ABDOMINAL HYSTERECTOMY    . CHOLECYSTECTOMY    . COLONOSCOPY WITH PROPOFOL N/A 01/15/2018   Procedure: COLONOSCOPY WITH PROPOFOL;  Surgeon: Lollie Sails, MD;  Location: [facility] ENDOSCOPY;  Service: Endoscopy;  Laterality: N/A;  . COLONOSCOPY WITH PROPOFOL N/A 02/11/2018   Procedure: COLONOSCOPY WITH PROPOFOL;  Surgeon: Lollie Sails, MD;  Location: [facility] ENDOSCOPY;  Service: Endoscopy;  Laterality: N/A;  . ROBOTIC ASSITED PARTIAL NEPHRECTOMY Left 2008  . TONSILLECTOMY      SOCIAL HISTORY:  Social History   Tobacco Use  . Smoking status: Current Every Day Smoker    Packs/day: 1.00    Types: Cigarettes  . Smokeless tobacco: Never Used  Substance Use Topics  . Alcohol use: No    FAMILY  HISTORY:  Family History  Problem Relation Age of Onset  . Breast cancer Maternal Aunt 13  . Breast cancer Maternal Grandmother 47    DRUG ALLERGIES:  Allergies  Allergen Reactions  . Eucalyptus Flavor [Flavoring Agent] Shortness Of Breath  . Macrolides And Ketolides Nausea And Vomiting    Azithromycin/erythromycin  . Sulfa Antibiotics Other (See Comments)    Contact dermatitis due to intravaginal preparation  . Mirapex [Pramipexole]   . Salmon Oil [Nutritional Supplements]   . Tramadol Hcl   . Zithromax [Azithromycin]     REVIEW OF SYSTEMS:   CONSTITUTIONAL: No fever, fatigue or weakness.  EYES: No blurred or double vision.  EARS, NOSE, AND THROAT: No tinnitus or ear pain.  RESPIRATORY: No cough, shortness of breath, wheezing or hemoptysis.  CARDIOVASCULAR: No chest pain, orthopnea, edema.  GASTROINTESTINAL: No nausea, vomiting, diarrhea or abdominal pain.  Has flank pain. GENITOURINARY: Has dysuria, no hematuria.  ENDOCRINE: No polyuria, nocturia,  HEMATOLOGY: No anemia, easy bruising or bleeding SKIN: No rash or lesion. MUSCULOSKELETAL: No joint pain or arthritis.   NEUROLOGIC: No tingling, numbness, weakness.  PSYCHIATRY: No anxiety or depression.   MEDICATIONS AT HOME:  Prior to Admission medications   Medication Sig Start Date End Date Taking? Authorizing Provider  albuterol (PROVENTIL HFA;VENTOLIN HFA) 108 (90 Base) MCG/ACT inhaler Inhale into the lungs every 6 (six) hours as needed for wheezing or shortness of breath.  Yes [provider]  furosemide (LASIX) 20 MG tablet Take 20 mg by mouth daily. 11/20/18  Yes [provider]  glimepiride (AMARYL) 2 MG tablet Take 2 mg by mouth daily. 03/10/19  Yes [provider]  HYDROcodone-acetaminophen (NORCO/VICODIN) 5-325 MG tablet Take 1 tablet by mouth every 4 (four) hours as needed.  06/25/17  Yes [provider]  lisinopril (PRINIVIL,ZESTRIL) 10 MG tablet Take 10 mg by mouth.   Yes  [provider]  meloxicam (MOBIC) 7.5 MG tablet Take 7.5 mg by mouth daily. 02/19/19 02/19/20 Yes [provider]  metFORMIN (GLUCOPHAGE) 500 MG tablet TAKE ONE TABLET BY MOUTH TWICE DAILY BEFORE MEAL(S) 02/12/17  Yes [provider]  potassium chloride (K-DUR) 10 MEQ tablet TAKE ONE TABLET BY MOUTH THREE TIMES DAILY 08/23/16  Yes [provider]  pramipexole (MIRAPEX) 0.25 MG tablet Take 0.25 mg by mouth at bedtime. 12/18/18  Yes [provider]      PHYSICAL EXAMINATION:   VITAL SIGNS: Blood pressure (!) 111/54, pulse (!) 101, temperature 99.4 F (37.4 C), temperature source Oral, resp. rate 20, height 5\' 1"  (1.549 m), weight 108.9 kg, SpO2 94 %.  GENERAL:  53 y.o.-year-old patient lying in the bed with no acute distress.  EYES: Pupils equal, round, reactive to light and accommodation. No scleral icterus. Extraocular muscles intact.  HEENT: Head atraumatic, normocephalic. Oropharynx and nasopharynx clear.  NECK:  Supple, no jugular venous distention. No thyroid enlargement, no tenderness.  LUNGS: Normal breath sounds bilaterally, no wheezing, rales,rhonchi or crepitation. No use of accessory muscles of respiration.  CARDIOVASCULAR: S1, S2 normal. No murmurs, rubs, or gallops.  ABDOMEN: Soft, tenderness left costovertebral area, nondistended. Bowel sounds present. No organomegaly or mass.  EXTREMITIES: No pedal edema, cyanosis, or clubbing.  NEUROLOGIC: Cranial nerves II through XII are intact. Muscle strength 5/5 in all extremities. Sensation intact. Gait not checked.  PSYCHIATRIC: The patient is alert and oriented x 3.  SKIN: No obvious rash, lesion, or ulcer.   LABORATORY PANEL:   CBC Recent Labs  Lab 03/23/19 1018  WBC 11.6*  HGB 12.9  HCT 40.2  PLT 172  MCV 87.6  MCH 28.1  MCHC 32.1  RDW 14.8  LYMPHSABS 1.6  MONOABS 1.5*  EOSABS 0.1  BASOSABS 0.1    ------------------------------------------------------------------------------------------------------------------  Chemistries  Recent Labs  Lab 03/23/19 1018  NA 137  K 4.7  CL 101  CO2 28  GLUCOSE 218*  BUN 13  CREATININE 1.06*  CALCIUM 9.0  AST 30  ALT 24  ALKPHOS 56  BILITOT 1.1   ------------------------------------------------------------------------------------------------------------------ estimated creatinine clearance is 68.3 mL/min (A) (by C-G formula based on SCr of 1.06 mg/dL (H)). ------------------------------------------------------------------------------------------------------------------ No results for input(s): TSH, T4TOTAL, T3FREE, THYROIDAB in the last 72 hours.  Invalid input(s): FREET3   Coagulation profile Recent Labs  Lab 03/23/19 1242  INR 1.1   ------------------------------------------------------------------------------------------------------------------- No results for input(s): DDIMER in the last 72 hours. -------------------------------------------------------------------------------------------------------------------  Cardiac Enzymes No results for input(s): CKMB, TROPONINI, MYOGLOBIN in the last 168 hours.  Invalid input(s): CK ------------------------------------------------------------------------------------------------------------------ Invalid input(s): POCBNP  ---------------------------------------------------------------------------------------------------------------  Urinalysis    Component Value Date/Time   COLORURINE AMBER (A) 03/23/2019 1018   APPEARANCEUR CLOUDY (A) 03/23/2019 1018   APPEARANCEUR Clear 09/25/2017 1103   LABSPEC 1.010 03/23/2019 1018   LABSPEC 1.009 06/10/2014 1237   PHURINE 7.0 03/23/2019 1018   GLUCOSEU 50 (A) 03/23/2019 1018   GLUCOSEU Negative 06/10/2014 1237   HGBUR MODERATE (A) 03/23/2019 1018   BILIRUBINUR NEGATIVE  03/23/2019 1018   BILIRUBINUR Negative 09/25/2017 1103    BILIRUBINUR Negative 06/10/2014 1237   KETONESUR NEGATIVE 03/23/2019 1018   PROTEINUR 100 (A) 03/23/2019 1018   NITRITE POSITIVE (A) 03/23/2019 1018   LEUKOCYTESUR LARGE (A) 03/23/2019 1018   LEUKOCYTESUR Negative 06/10/2014 1237     RADIOLOGY: US Renal  Result Date: 03/23/2019 CLINICAL DATA:  Renal stones, flank pain, dysuria, fever EXAM: RENAL / URINARY TRACT ULTRASOUND COMPLETE COMPARISON:  CT abdomen/pelvis dated 01/23/2019 FINDINGS: Right Kidney: Renal measurements: 10.1 x 5.1 x 4.1 cm = volume: 110 mL . Echogenicity within normal limits. No mass or hydronephrosis visualized. Left Kidney: Renal measurements: 12.3 x 4.6 x 5.4 cm = volume: 162 mL. Echogenicity within normal limits. No mass or hydronephrosis visualized. Bladder: Appears normal for degree of bladder distention. Other: None. IMPRESSION: Negative renal ultrasound. Electronically Signed   By: Julian Hy M.D.   On: 03/23/2019 11:05    EKG: Orders placed or performed during the hospital encounter of 03/23/19  . ED EKG 12-Lead  . ED EKG 12-Lead  . EKG 12-Lead  . EKG 12-Lead  . EKG 12-Lead  . EKG 12-Lead    IMPRESSION AND PLAN: 53 year old female patient with a known history of type 2 diabetes mellitus, GERD, gout, hypertension, renal cancer, sleep apnea, arthritis presented to the emergency room for flank pain.  Patient had low-grade fever and chills.  -Sepsis secondary to UTI Admit patient to medical floor IV fluids .IV Rocephin antibiotic and follow-up urine culture  -Acute UTI IV Rocephin antibiotic daily Follow-up culture and sensitivity  -Dehydration IV fluids  -Type 2 diabetes mellitus Diabetic diet with sliding scale coverage with insulin  -DVT prophylaxis subcu Lovenox daily  -Hypotension secondary to sepsis IV fluids  All the records are reviewed and case discussed with ED provider. Management plans discussed with the patient, family and they are in agreement.  CODE STATUS:Full  code  TOTAL TIME TAKING CARE OF THIS PATIENT: 52 minutes.    Saundra Shelling M.D on 03/23/2019 at 1:33 PM  Between 7am to 6pm - Pager - [phone]  After 6pm go to [email or web address] - password [email or web address] [place] [facility]  Office  [phone]  CC: Primary care physician; Rusty Aus, MD

## 2019-03-23 NOTE — ED Provider Notes (Addendum)
[facility] Emergency Department Provider Note   ____________________________________________   First MD Initiated Contact with Patient 03/23/19 1008     (approximate)  I have reviewed the triage vital signs and the nursing notes.   HISTORY  Chief Complaint Flank Pain    HPI EPHRATA MCADAM is a 53 y.o. female who reports that her visit 2 months ago was for what appears to have been a UTI and pyelonephritis.  CT at that time showed no obstruction and no obstructing stones.  Patient reports she has never gotten over the dysuria and flank pain and is gotten worse now.  She had a fever of 101 yesterday.  She had some nausea.  She had a lot of dysuria.  She took some Azo yesterday.  Here she is tachycardic at 115 with a temperature of 99.4.  She has a little bit of right sided flank discomfort and vague right-sided abdominal discomfort.        Past Medical History:  Diagnosis Date  . Arthritis   . Diabetes mellitus without complication ([place])   . GERD (gastroesophageal reflux disease)   . Gout   . Hypertension   . Renal cancer ([place]) 2008  . Sleep apnea     There are no active problems to display for this patient.   Past Surgical History:  Procedure Laterality Date  . ABDOMINAL HYSTERECTOMY    . CHOLECYSTECTOMY    . COLONOSCOPY WITH PROPOFOL N/A 01/15/2018   Procedure: COLONOSCOPY WITH PROPOFOL;  Surgeon: Lollie Sails, MD;  Location: [facility] ENDOSCOPY;  Service: Endoscopy;  Laterality: N/A;  . COLONOSCOPY WITH PROPOFOL N/A 02/11/2018   Procedure: COLONOSCOPY WITH PROPOFOL;  Surgeon: Lollie Sails, MD;  Location: [facility] ENDOSCOPY;  Service: Endoscopy;  Laterality: N/A;  . ROBOTIC ASSITED PARTIAL NEPHRECTOMY Left 2008  . TONSILLECTOMY      Prior to Admission medications   Medication Sig Start Date End Date Taking? Authorizing Provider  albuterol (PROVENTIL HFA;VENTOLIN HFA) 108 (90 Base) MCG/ACT inhaler Inhale into the lungs every 6 (six) hours  as needed for wheezing or shortness of breath.    [provider]  Cholecalciferol (VITAMIN D3) 2000 units capsule Take by mouth.    [provider]  cyanocobalamin 2000 MCG tablet Take 2,000 mcg by mouth daily.    [provider]  cyclobenzaprine (FLEXERIL) 10 MG tablet Take 1 tablet (10 mg total) by mouth 3 (three) times daily as needed. Patient not taking: Reported on 02/11/2018 12/15/17   Sable Feil, PA-C  furosemide (LASIX) 10 MG/ML solution Take by mouth.    [provider]  HYDROcodone-acetaminophen (NORCO/VICODIN) 5-325 MG tablet Take by mouth. 06/25/17   [provider]  ibuprofen (ADVIL,MOTRIN) 600 MG tablet Take 1 tablet (600 mg total) by mouth every 8 (eight) hours as needed. Patient not taking: Reported on 02/11/2018 12/15/17   Sable Feil, PA-C  lisinopril (PRINIVIL,ZESTRIL) 10 MG tablet Take 10 mg by mouth.    [provider]  Magnesium Gluconate 550 MG TABS Take 30 mg by mouth.    [provider]  metFORMIN (GLUCOPHAGE) 500 MG tablet TAKE ONE TABLET BY MOUTH TWICE DAILY BEFORE MEAL(S) 02/12/17   [provider]  methylPREDNISolone (MEDROL DOSEPAK) 4 MG TBPK tablet Take 6 pills on day one then decrease by 1 pill each day 07/03/18   Versie Starks, PA-C  omeprazole (PRILOSEC) 20 MG capsule TAKE ONE CAPSULE BY MOUTH TWICE DAILY 02/12/17   [provider]  ondansetron (ZOFRAN) 4 MG tablet Take 1 tablet (4 mg total) by mouth daily as needed for nausea or vomiting. 01/23/19 01/23/20  Vanessa [place], MD  potassium chloride (K-DUR) 10 MEQ tablet TAKE ONE TABLET BY MOUTH THREE TIMES DAILY 08/23/16   [provider]  traMADol (ULTRAM) 50 MG tablet Take 1 tablet (50 mg total) by mouth every 12 (twelve) hours as needed. Patient not taking: Reported on 02/11/2018 12/15/17   Sable Feil, PA-C    Allergies Eucalyptus flavor [flavoring agent], Macrolides and ketolides, Sulfa antibiotics, Mirapex [pramipexole],  Salmon oil [nutritional supplements], Tramadol hcl, and Zithromax [azithromycin]  Family History  Problem Relation Age of Onset  . Breast cancer Maternal Aunt 45  . Breast cancer Maternal Grandmother 70    Social History Social History   Tobacco Use  . Smoking status: Current Every Day Smoker    Packs/day: 1.00    Types: Cigarettes  . Smokeless tobacco: Never Used  Substance Use Topics  . Alcohol use: No  . Drug use: No    Review of Systems  Constitutional:  fever/chills Eyes: No visual changes. ENT: No sore throat. Cardiovascular: Denies chest pain. Respiratory: Denies shortness of breath. Gastrointestinal: No abdominal pain.  No nausea, no vomiting.  No diarrhea.  No constipation. Genitourinary:dysuria. Musculoskeletal: Negative for back pain.  This does not include the CVA pain that she does have. Skin: Negative for rash. Neurological: Negative for headaches, focal weakness  ____________________________________________   PHYSICAL EXAM:  VITAL SIGNS: ED Triage Vitals  Enc Vitals Group     BP 03/23/19 0959 (!) 149/60     Pulse Rate 03/23/19 0959 (!) 115     Resp 03/23/19 0959 18     Temp 03/23/19 0959 99.4 F (37.4 C)     Temp Source 03/23/19 0959 Oral     SpO2 03/23/19 0959 96 %     Weight 03/23/19 0955 240 lb (108.9 kg)     Height 03/23/19 0955 5\' 1"  (1.549 m)     Head Circumference --      Peak Flow --      Pain Score 03/23/19 0955 8     Pain Loc --      Pain Edu? --      Excl. in [place]? --     Constitutional: Alert and oriented. Well appearing and in no acute distress. Eyes: Conjunctivae are normal. Head: Atraumatic. Nose: No congestion/rhinnorhea. Mouth/Throat: Mucous membranes are moist.  Oropharynx non-erythematous. Neck: No stridor.   Cardiovascular: Normal rate, regular rhythm. Grossly normal heart sounds.  Good peripheral circulation. Respiratory: Normal respiratory effort.  No retractions. Lungs CTAB. Gastrointestinal: Soft some diffuse  right-sided discomfort to palpation no distention. No abdominal bruits.  Right sided CVA tenderness. Musculoskeletal: No lower extremity tenderness chronic mild left edema.   Neurologic:  Normal speech and language. No gross focal neurologic deficits are appreciated. No gait instability. Skin:  Skin is warm, dry and intact. No rash noted. Psychiatric: Mood and affect are normal. Speech and behavior are normal.  ____________________________________________   LABS (all labs ordered are listed, but only abnormal results are displayed)  Labs Reviewed  URINALYSIS, COMPLETE (UACMP) WITH MICROSCOPIC - Abnormal; Notable for the following components:      Result Value   Color, Urine AMBER (*)    APPearance CLOUDY (*)    Glucose, UA 50 (*)    Hgb urine dipstick MODERATE (*)    Protein, ur 100 (*)    Nitrite POSITIVE (*)  Leukocytes,Ua LARGE (*)    RBC / HPF >50 (*)    WBC, UA >50 (*)    Bacteria, UA RARE (*)    All other components within normal limits  COMPREHENSIVE METABOLIC PANEL - Abnormal; Notable for the following components:   Glucose, Bld 218 (*)    Creatinine, Ser 1.06 (*)    GFR calc non Af Amer 59 (*)    All other components within normal limits  CBC WITH DIFFERENTIAL/PLATELET - Abnormal; Notable for the following components:   WBC 11.6 (*)    Neutro Abs 8.3 (*)    Monocytes Absolute 1.5 (*)    All other components within normal limits  URINE CULTURE   ____________________________________________  EKG  EKG read interpreted by me shows sinus tach at 104 normal axis no acute ST-T wave changes.  Computer is reading minimal elevation in lateral leads I do not see this.  ____________________________________________  RADIOLOGY  ED MD interpretation:  Official radiology report(s): US Renal  Result Date: 03/23/2019 CLINICAL DATA:  Renal stones, flank pain, dysuria, fever EXAM: RENAL / URINARY TRACT ULTRASOUND COMPLETE COMPARISON:  CT abdomen/pelvis dated 01/23/2019  FINDINGS: Right Kidney: Renal measurements: 10.1 x 5.1 x 4.1 cm = volume: 110 mL . Echogenicity within normal limits. No mass or hydronephrosis visualized. Left Kidney: Renal measurements: 12.3 x 4.6 x 5.4 cm = volume: 162 mL. Echogenicity within normal limits. No mass or hydronephrosis visualized. Bladder: Appears normal for degree of bladder distention. Other: None. IMPRESSION: Negative renal ultrasound. Electronically Signed   By: Julian Hy M.D.   On: 03/23/2019 11:05    ____________________________________________   PROCEDURES  Procedure(s) performed (including Critical Care):  Procedures   ____________________________________________   INITIAL IMPRESSION / ASSESSMENT AND PLAN / ED COURSE  Patient reports she feels somewhat better.  Wants to try to go home.  Heart rate is only 103 at this point.  She has not been febrile over 99.  I have given her some Rocephin.  We will try and see if she can tolerate standing etc. she can we can try to let her go home.  She promises to return if she gets any worse.    She reports she was on Cipro last time and then Keflex.  Neither 1 of these worked well.  Thing that works best for her UTI seems to be Bactrim.  I will give her prescription for this.  She had a contact dermatitis with Bactrim topical but has taken Bactrim in the past.  Without difficulty and I was after the contact dermatitis.  He go back in the room to talk with her and her blood pressure has dropped now.  She has several low readings.  Change my mind about letting her go home.          ____________________________________________   FINAL CLINICAL IMPRESSION(S) / ED DIAGNOSES  Final diagnoses:  Pyelonephritis  Sepsis without acute organ dysfunction, due to unspecified organism [facility])     ED Discharge Orders    None       Note:  This document was prepared using Dragon voice recognition software and may include unintentional dictation errors.    Nena Polio, MD 03/23/19 1209    Nena Polio, MD 03/31/19 [phone]

## 2019-03-23 NOTE — ED Notes (Signed)
Pt placed on 2L via [place] due to desat after admin of Morphine. Pt 100% on 2L via .

## 2019-03-23 NOTE — ED Triage Notes (Signed)
Pt c/o flank pain, painful urination and states she started running a fever with it. Was seen here about 3 weeks ago dx with pyelonephritis. States she felt a little better but sx never went away.

## 2019-03-24 LAB — GLUCOSE, CAPILLARY
Glucose-Capillary: 113 mg/dL — ABNORMAL HIGH (ref 70–99)
Glucose-Capillary: 155 mg/dL — ABNORMAL HIGH (ref 70–99)
Glucose-Capillary: 99 mg/dL (ref 70–99)
Glucose-Capillary: 158 mg/dL — ABNORMAL HIGH (ref 70–99)

## 2019-03-24 LAB — BASIC METABOLIC PANEL
Anion gap: 6 (ref 5–15)
BUN: 11 mg/dL (ref 6–20)
CO2: 28 mmol/L (ref 22–32)
Calcium: 8.2 mg/dL — ABNORMAL LOW (ref 8.9–10.3)
Chloride: 104 mmol/L (ref 98–111)
Creatinine, Ser: 0.91 mg/dL (ref 0.44–1.00)
GFR calc Af Amer: >60 mL/min (ref >60)
GFR calc non Af Amer: >60 mL/min (ref >60)
Glucose, Bld: 202 mg/dL — ABNORMAL HIGH (ref 70–99)
Potassium: 3.6 mmol/L (ref 3.5–5.1)
Sodium: 138 mmol/L (ref 135–145)

## 2019-03-24 LAB — CBC
HCT: 35.8 % — ABNORMAL LOW (ref 36.0–46.0)
Hemoglobin: 11.2 g/dL — ABNORMAL LOW (ref 12.0–15.0)
MCH: 27.9 pg (ref 26.0–34.0)
MCHC: 31.3 g/dL (ref 30.0–36.0)
MCV: 89.1 fL (ref 80.0–100.0)
Platelets: 162 10*3/uL (ref 150–400)
RBC: 4.02 MIL/uL (ref 3.87–5.11)
RDW: 15.2 % (ref 11.5–15.5)
WBC: 14.5 10*3/uL — ABNORMAL HIGH (ref 4.0–10.5)
nRBC: 0 % (ref 0.0–0.2)

## 2019-03-24 MED ORDER — PHENAZOPYRIDINE HCL 100 MG PO TABS
100.0000 mg | ORAL_TABLET | Freq: Three times a day (TID) | ORAL | Status: DC
  Administered 2019-03-24 – 2019-03-25 (×4): 100 mg via ORAL
  Filled 2019-03-24 (×5): qty 1

## 2019-03-24 MED ORDER — PRAMIPEXOLE DIHYDROCHLORIDE 0.25 MG PO TABS
0.2500 mg | ORAL_TABLET | Freq: Every day | ORAL | Status: DC
  Administered 2019-03-24: 0.25 mg via ORAL
  Filled 2019-03-24: qty 1

## 2019-03-24 MED ORDER — FUROSEMIDE 20 MG PO TABS
20.0000 mg | ORAL_TABLET | Freq: Every day | ORAL | Status: DC
  Administered 2019-03-24 – 2019-03-25 (×2): 20 mg via ORAL
  Filled 2019-03-24 (×2): qty 1

## 2019-03-24 NOTE — Progress Notes (Signed)
[facility] [place] NAME: Martha Hill    MR#:  [id number]  DATE OF BIRTH:  1966/04/27  SUBJECTIVE:   Patient presented to the hospital due to flank pain and noted to have a urinary tract infection and suspected [place].  Patient feels a little bit better today.  Denies any nausea vomiting or worsening flank pain.  REVIEW OF SYSTEMS:    Review of Systems  Constitutional: Negative for chills and fever.  HENT: Negative for congestion and tinnitus.   Eyes: Negative for blurred vision and double vision.  Respiratory: Negative for cough, shortness of breath and wheezing.   Cardiovascular: Negative for chest pain, orthopnea and PND.  Gastrointestinal: Positive for abdominal pain (Flank pain). Negative for diarrhea, nausea and vomiting.  Genitourinary: Negative for dysuria and hematuria.  Neurological: Negative for dizziness, sensory change and focal weakness.  All other systems reviewed and are negative.   Nutrition: Heart Healthy/Carb control Tolerating Diet: Yes Tolerating PT: Await Eval.   DRUG ALLERGIES:   Allergies  Allergen Reactions   Eucalyptus Flavor [Flavoring Agent] Shortness Of Breath   Macrolides And Ketolides Nausea And Vomiting    Azithromycin/erythromycin   Sulfa Antibiotics Other (See Comments)    Contact dermatitis due to intravaginal preparation   Mirapex [Pramipexole]    Salmon Oil [Nutritional Supplements]    Tramadol Hcl    Zithromax [Azithromycin]     VITALS:  Blood pressure 122/69, pulse (!) 103, temperature 99.1 F (37.3 C), temperature source Oral, resp. rate 20, height _0  (1.549 m), weight 108.9 kg, SpO2 90 %.  PHYSICAL EXAMINATION:   Physical Exam  GENERAL:  54 y.o.-year-old patient lying in bed in no acute distress.  EYES: Pupils equal, round, reactive to light and accommodation. No scleral icterus. Extraocular muscles intact.  HEENT: Head atraumatic, normocephalic. Oropharynx and  nasopharynx clear.  NECK:  Supple, no jugular venous distention. No thyroid enlargement, no tenderness.  LUNGS: Normal breath sounds bilaterally, no wheezing, rales, rhonchi. No use of accessory muscles of respiration.  CARDIOVASCULAR: S1, S2 normal. No murmurs, rubs, or gallops.  ABDOMEN: Soft, Tender in right flank, nondistended. Bowel sounds present. No organomegaly or mass.  EXTREMITIES: No cyanosis, clubbing or edema b/l.    NEUROLOGIC: Cranial nerves II through XII are intact. No focal Motor or sensory deficits b/l.   PSYCHIATRIC: The patient is alert and oriented x 3.  SKIN: No obvious rash, lesion, or ulcer.    LABORATORY PANEL:   CBC Recent Labs  Lab 03/24/19 0512  WBC 14.5*  HGB 11.2*  HCT 35.8*  PLT 162   ------------------------------------------------------------------------------------------------------------------  Chemistries  Recent Labs  Lab 03/23/19 1018 03/24/19 0512  NA 137 138  K 4.7 3.6  CL 101 104  CO2 28 28  GLUCOSE 218* 202*  BUN 13 11  CREATININE 1.06* 0.91  CALCIUM 9.0 8.2*  AST 30  --   ALT 24  --   ALKPHOS 56  --   BILITOT 1.1  --    ------------------------------------------------------------------------------------------------------------------  Cardiac Enzymes No results for input(s): TROPONINI in the last 168 hours. ------------------------------------------------------------------------------------------------------------------  RADIOLOGY:  US Renal  Result Date: 03/23/2019 CLINICAL DATA:  Renal stones, flank pain, dysuria, fever EXAM: RENAL / URINARY TRACT ULTRASOUND COMPLETE COMPARISON:  CT abdomen/pelvis dated 01/23/2019 FINDINGS: Right Kidney: Renal measurements: 10.1 x 5.1 x 4.1 cm = volume: 110 mL . Echogenicity within normal limits. No mass or hydronephrosis visualized. Left Kidney: Renal measurements: 12.3 x 4.6 x  5.4 cm = volume: 162 mL. Echogenicity within normal limits. No mass or hydronephrosis visualized. Bladder:  Appears normal for degree of bladder distention. Other: None. IMPRESSION: Negative renal ultrasound. Electronically Signed   By: Julian Hy M.D.   On: 03/23/2019 11:05     ASSESSMENT AND PLAN:   53 year old female with past medical history of struct of sleep apnea, gout, GERD, diabetes, hypertension, renal cell carcinoma who presented to the hospital due to abdominal pain, dysuria and noted to have sepsis secondary to pyelonephritis.  1.  Sepsis-patient met criteria admission given her fever, leukocytosis and positive urinalysis. -Continue IV ceftriaxone, follow cultures.  Clinically improving. -Presently afebrile and hemodynamically stable.  2.  Acute pyelonephritis-source of patient's sepsis. -Urinalysis was positive for UTI.  Renal ultrasound negative for acute pathology.  Continue IV ceftriaxone, supportive care with IV fluids, antiemetics and pain control. -Clinically patient is improving. - cont. Pyridium.    3.  Leukocytosis-secondary to acute pyelonephritis. -Follow with IV antibiotic therapy.  4. Essential Hypertension - cont. Lisinopril.   5. DM - BS stable.  - cont. SSI and carb control diet.   6. GERD - cont. Protonix.      All the records are reviewed and case discussed with Care Management/Social Worker. Management plans discussed with the patient, family and they are in agreement.  CODE STATUS: Full code  DVT Prophylaxis: Lovenox  TOTAL TIME TAKING CARE OF THIS PATIENT: 30 minutes.   POSSIBLE D/C IN 1-2 DAYS, DEPENDING ON CLINICAL CONDITION.   Henreitta Leber M.D on 03/24/2019 at 2:35 PM  Between 7am to 6pm - Pager - [phone]  After 6pm go to [email or web address] - Proofreader  [facility]  Office  [phone]  CC: Primary care physician; Rusty Aus, MD

## 2019-03-25 LAB — CBC
HCT: 37.7 % (ref 36.0–46.0)
Hemoglobin: 11.8 g/dL — ABNORMAL LOW (ref 12.0–15.0)
MCH: 27.9 pg (ref 26.0–34.0)
MCHC: 31.3 g/dL (ref 30.0–36.0)
MCV: 89.1 fL (ref 80.0–100.0)
Platelets: 157 10*3/uL (ref 150–400)
RBC: 4.23 MIL/uL (ref 3.87–5.11)
RDW: 14.9 % (ref 11.5–15.5)
WBC: 11 10*3/uL — ABNORMAL HIGH (ref 4.0–10.5)
nRBC: 0 % (ref 0.0–0.2)

## 2019-03-25 LAB — URINE CULTURE: Culture: >=100000 — AB

## 2019-03-25 LAB — HEMOGLOBIN A1C
Hgb A1c MFr Bld: 6.8 % — ABNORMAL HIGH (ref 4.8–5.6)
Mean Plasma Glucose: 148 mg/dL

## 2019-03-25 LAB — GLUCOSE, CAPILLARY: Glucose-Capillary: 120 mg/dL — ABNORMAL HIGH (ref 70–99)

## 2019-03-25 MED ORDER — OMEPRAZOLE 20 MG PO CPDR: 20.0000 mg | DELAYED_RELEASE_CAPSULE | Freq: Two times a day (BID) | ORAL | Status: AC

## 2019-03-25 MED ORDER — PHENAZOPYRIDINE HCL 100 MG PO TABS: 100.0000 mg | ORAL_TABLET | Freq: Three times a day (TID) | ORAL | 0 refills | Status: DC

## 2019-03-25 MED ORDER — CEPHALEXIN 500 MG PO CAPS: 500.0000 mg | ORAL_CAPSULE | Freq: Three times a day (TID) | ORAL | 0 refills | Status: DC

## 2019-03-25 MED ORDER — CYANOCOBALAMIN 2000 MCG PO TABS: 2000.0000 ug | ORAL_TABLET | Freq: Every day | ORAL | Status: DC

## 2019-03-25 MED ORDER — CEFUROXIME AXETIL 250 MG PO TABS: 250.0000 mg | ORAL_TABLET | Freq: Two times a day (BID) | ORAL | 0 refills | Status: AC

## 2019-03-25 MED ORDER — CEPHALEXIN 500 MG PO CAPS
500.0000 mg | ORAL_CAPSULE | Freq: Three times a day (TID) | ORAL | Status: DC
  Administered 2019-03-25: 500 mg via ORAL
  Filled 2019-03-25: qty 1

## 2019-03-25 NOTE — Progress Notes (Signed)
Martha Hill to be D/C'd home per MD order.  Discussed prescriptions and follow up appointments with the patient. Prescriptions given to patient, medication list explained in detail. Pt verbalized understanding.  Allergies as of 03/25/2019      Reactions   Eucalyptus Flavor [flavoring Agent] Shortness Of Breath   Macrolides And Ketolides Nausea And Vomiting   Azithromycin/erythromycin   Sulfa Antibiotics Other (See Comments)   Contact dermatitis due to intravaginal preparation   Mirapex [pramipexole]    Salmon Oil [nutritional Supplements]    Tramadol Hcl    Zithromax [azithromycin]       Medication List    TAKE these medications   albuterol 108 (90 Base) MCG/ACT inhaler Commonly known as: VENTOLIN HFA Inhale into the lungs every 6 (six) hours as needed for wheezing or shortness of breath.   cefUROXime 250 MG tablet Commonly known as: CEFTIN Take 1 tablet (250 mg total) by mouth 2 (two) times daily with a meal for 4 days.   cyanocobalamin 2000 MCG tablet Take 1 tablet (2,000 mcg total) by mouth daily.   furosemide 20 MG tablet Commonly known as: LASIX Take 20 mg by mouth daily.   glimepiride 2 MG tablet Commonly known as: AMARYL Take 2 mg by mouth daily.   HYDROcodone-acetaminophen 5-325 MG tablet Commonly known as: NORCO/VICODIN Take 1 tablet by mouth every 4 (four) hours as needed.   lisinopril 10 MG tablet Commonly known as: ZESTRIL Take 10 mg by mouth.   meloxicam 7.5 MG tablet Commonly known as: MOBIC Take 7.5 mg by mouth daily.   metFORMIN 500 MG tablet Commonly known as: GLUCOPHAGE TAKE ONE TABLET BY MOUTH TWICE DAILY BEFORE MEAL(S)   omeprazole 20 MG capsule Commonly known as: PRILOSEC Take 1 capsule (20 mg total) by mouth 2 (two) times daily.   phenazopyridine 100 MG tablet Commonly known as: PYRIDIUM Take 1 tablet (100 mg total) by mouth 3 (three) times daily with meals.   potassium chloride 10 MEQ tablet Commonly known as: KLOR-CON TAKE ONE  TABLET BY MOUTH THREE TIMES DAILY   pramipexole 0.25 MG tablet Commonly known as: MIRAPEX Take 0.25 mg by mouth at bedtime.       Vitals:   03/24/19 2051 03/25/19 0442  BP: (!) 130/46 (!) 142/79  Pulse: (!) 106 95  Resp: 20 20  Temp: 98.6 F (37 C) 98.1 F (36.7 C)  SpO2: 91% 94%    Skin clean, dry and intact without evidence of skin break down, no evidence of skin tears noted. IV catheter discontinued intact. Site without signs and symptoms of complications. Dressing and pressure applied. Pt denies pain at this time. No complaints noted.  An After Visit Summary was printed and given to the patient. Patient escorted via [place], and D/C home via private auto.  Patient without insurance. Provided Goodrx coupon at [employer]. Total for both medications is $16. Patient tells me she can afford this.   Martha Hill

## 2019-03-27 NOTE — Discharge Summary (Signed)
[facility] [place] NAME: Martha Hill    MR#:  [id number]  DATE OF BIRTH:  Oct 19, 1965  DATE OF ADMISSION:  03/23/2019 ADMITTING PHYSICIAN: Saundra Shelling, MD  DATE OF DISCHARGE: 03/25/2019  1:28 PM  PRIMARY CARE PHYSICIAN: Rusty Aus, MD    ADMISSION DIAGNOSIS:  Pyelonephritis [N12] Sepsis without acute organ dysfunction, due to unspecified organism ([place]) [A41.9] Sepsis ([place]) [A41.9]  DISCHARGE DIAGNOSIS:  Active Problems:   Acute pyelonephritis   Sepsis ([place])   SECONDARY DIAGNOSIS:   Past Medical History:  Diagnosis Date  . Arthritis   . Diabetes mellitus without complication ([place])   . GERD (gastroesophageal reflux disease)   . Gout   . Hypertension   . Renal cancer ([place]) 2008  . Sleep apnea     HOSPITAL COURSE:   53 year old female with past medical history of struct of sleep apnea, gout, GERD, diabetes, hypertension, renal cell carcinoma who presented to the hospital due to abdominal pain, dysuria and noted to have sepsis secondary to pyelonephritis.  1.  Sepsis-patient met criteria  On admission given her fever, leukocytosis and positive urinalysis. -Patient was treated with IV ceftriaxone for her UTI.  Patient has clinically improved with IV antibiotic therapy her blood cultures remain negative.  Patient is afebrile and hemodynamically stable her white cell count has come down she is being discharged home.  2.  Acute pyelonephritis-source of patient's sepsis. -Urinalysis was positive for UTI.  Renal ultrasound was negative for acute pathology.  -Patient was treated with IV ceftriaxone while in the hospital and given IV fluids and antiemetics and pain control.  Patient's urine cultures came back positive for E. coli which was sensitive to ceftriaxone and patient is now being discharged on oral course of Ceftin. -Patient will continue Pyridium as needed for her bladder spasm.  Her blood cultures remain  negative.  3.  Leukocytosis-secondary to acute pyelonephritis. -Improved and resolved with IV antibiotic therapy.  4. Essential Hypertension - pt. Will cont. Lisinopril.   5. DM -while in the hospital patient was on sliding scale insulin but she will resume her metformin, Glimeperide upon discharge.  6. GERD - pt. Will cont. Her Omeprazole.   7. Hx of Restless leg syndrome -pt. Will cont. Her Mirapex.    DISCHARGE CONDITIONS:   Stable.   CONSULTS OBTAINED:    DRUG ALLERGIES:   Allergies  Allergen Reactions  . Eucalyptus Flavor [Flavoring Agent] Shortness Of Breath  . Macrolides And Ketolides Nausea And Vomiting    Azithromycin/erythromycin  . Sulfa Antibiotics Other (See Comments)    Contact dermatitis due to intravaginal preparation  . Mirapex [Pramipexole]   . Salmon Oil [Nutritional Supplements]   . Tramadol Hcl   . Zithromax [Azithromycin]     DISCHARGE MEDICATIONS:   Allergies as of 03/25/2019      Reactions   Eucalyptus Flavor [flavoring Agent] Shortness Of Breath   Macrolides And Ketolides Nausea And Vomiting   Azithromycin/erythromycin   Sulfa Antibiotics Other (See Comments)   Contact dermatitis due to intravaginal preparation   Mirapex [pramipexole]    Salmon Oil [nutritional Supplements]    Tramadol Hcl    Zithromax [azithromycin]       Medication List    TAKE these medications   albuterol 108 (90 Base) MCG/ACT inhaler Commonly known as: VENTOLIN HFA Inhale into the lungs every 6 (six) hours as needed for wheezing or shortness of breath.   cefUROXime 250 MG tablet Commonly known as: CEFTIN  Take 1 tablet (250 mg total) by mouth 2 (two) times daily with a meal for 4 days.   cyanocobalamin 2000 MCG tablet Take 1 tablet (2,000 mcg total) by mouth daily.   furosemide 20 MG tablet Commonly known as: LASIX Take 20 mg by mouth daily.   glimepiride 2 MG tablet Commonly known as: AMARYL Take 2 mg by mouth daily.    HYDROcodone-acetaminophen 5-325 MG tablet Commonly known as: NORCO/VICODIN Take 1 tablet by mouth every 4 (four) hours as needed.   lisinopril 10 MG tablet Commonly known as: ZESTRIL Take 10 mg by mouth.   meloxicam 7.5 MG tablet Commonly known as: MOBIC Take 7.5 mg by mouth daily.   metFORMIN 500 MG tablet Commonly known as: GLUCOPHAGE TAKE ONE TABLET BY MOUTH TWICE DAILY BEFORE MEAL(S)   omeprazole 20 MG capsule Commonly known as: PRILOSEC Take 1 capsule (20 mg total) by mouth 2 (two) times daily.   phenazopyridine 100 MG tablet Commonly known as: PYRIDIUM Take 1 tablet (100 mg total) by mouth 3 (three) times daily with meals.   potassium chloride 10 MEQ tablet Commonly known as: KLOR-CON TAKE ONE TABLET BY MOUTH THREE TIMES DAILY   pramipexole 0.25 MG tablet Commonly known as: MIRAPEX Take 0.25 mg by mouth at bedtime.         DISCHARGE INSTRUCTIONS:   DIET:  Cardiac diet and Diabetic diet  DISCHARGE CONDITION:  Stable  ACTIVITY:  Activity as tolerated  OXYGEN:  Home Oxygen: No.   Oxygen Delivery: room air  DISCHARGE LOCATION:  home   If you experience worsening of your admission symptoms, develop shortness of breath, life threatening emergency, suicidal or homicidal thoughts you must seek medical attention immediately by calling 911 or calling your MD immediately  if symptoms less severe.  You Must read complete instructions/literature along with all the possible adverse reactions/side effects for all the Medicines you take and that have been prescribed to you. Take any new Medicines after you have completely understood and accpet all the possible adverse reactions/side effects.   Please note  You were cared for by a hospitalist during your hospital stay. If you have any questions about your discharge medications or the care you received while you were in the hospital after you are discharged, you can call the unit and asked to speak with the  hospitalist on call if the hospitalist that took care of you is not available. Once you are discharged, your primary care physician will handle any further medical issues. Please note that NO REFILLS for any discharge medications will be authorized once you are discharged, as it is imperative that you return to your primary care physician (or establish a relationship with a primary care physician if you do not have one) for your aftercare needs so that they can reassess your need for medications and monitor your lab values.     Today   No acute events overnight, denies any further abdominal pain nausea or vomiting.  Afebrile.  White cell count normalized.  Will discharge home today on oral antibiotics.  VITAL SIGNS:  Blood pressure (!) 142/79, pulse 95, temperature 98.1 F (36.7 C), temperature source Oral, resp. rate 20, height _0  (1.549 m), weight 108.9 kg, SpO2 94 %.  I/O:  No intake or output data in the 24 hours ending 03/27/19 1527  PHYSICAL EXAMINATION:  GENERAL:  53 y.o.-year-old obese patient lying in the bed with no acute distress.  EYES: Pupils equal, round, reactive to light and accommodation.  No scleral icterus. Extraocular muscles intact.  HEENT: Head atraumatic, normocephalic. Oropharynx and nasopharynx clear.  NECK:  Supple, no jugular venous distention. No thyroid enlargement, no tenderness.  LUNGS: Normal breath sounds bilaterally, no wheezing, rales,rhonchi. No use of accessory muscles of respiration.  CARDIOVASCULAR: S1, S2 normal. No murmurs, rubs, or gallops.  ABDOMEN: Soft, non-tender, non-distended. Bowel sounds present. No organomegaly or mass.  EXTREMITIES: No pedal edema, cyanosis, or clubbing.  NEUROLOGIC: Cranial nerves II through XII are intact. No focal motor or sensory defecits b/l.  PSYCHIATRIC: The patient is alert and oriented x 3.  SKIN: No obvious rash, lesion, or ulcer.   DATA REVIEW:   CBC Recent Labs  Lab 03/25/19 0452  WBC 11.0*  HGB  11.8*  HCT 37.7  PLT 157    Chemistries  Recent Labs  Lab 03/23/19 1018 03/24/19 0512  NA 137 138  K 4.7 3.6  CL 101 104  CO2 28 28  GLUCOSE 218* 202*  BUN 13 11  CREATININE 1.06* 0.91  CALCIUM 9.0 8.2*  AST 30  --   ALT 24  --   ALKPHOS 56  --   BILITOT 1.1  --     Cardiac Enzymes No results for input(s): TROPONINI in the last 168 hours.  Microbiology Results  Results for orders placed or performed during the hospital encounter of 03/23/19  Urine culture     Status: Abnormal   Collection Time: 03/23/19 10:19 AM   Specimen: Urine, Random  Result Value Ref Range Status   Specimen Description   Final    URINE, RANDOM Performed at [facility], [address] [place], [place] [address]    Special Requests   Final    NONE Performed at [facility], [address] [place], [place] [address]    Culture >=100,000 COLONIES/mL ESCHERICHIA COLI (A)  Final   Report Status 03/25/2019 FINAL  Final   Organism ID, Bacteria ESCHERICHIA COLI (A)  Final      Susceptibility   Escherichia coli - MIC*    AMPICILLIN >=32 RESISTANT Resistant     CEFAZOLIN <=4 SENSITIVE Sensitive     CEFTRIAXONE <=1 SENSITIVE Sensitive     CIPROFLOXACIN <=0.25 SENSITIVE Sensitive     GENTAMICIN >=16 RESISTANT Resistant     IMIPENEM <=0.25 SENSITIVE Sensitive     NITROFURANTOIN <=16 SENSITIVE Sensitive     TRIMETH/SULFA >=320 RESISTANT Resistant     AMPICILLIN/SULBACTAM >=32 RESISTANT Resistant     PIP/TAZO <=4 SENSITIVE Sensitive     Extended ESBL NEGATIVE Sensitive     * >=100,000 COLONIES/mL ESCHERICHIA COLI  Blood Culture (routine x 2)     Status: None (Preliminary result)   Collection Time: 03/23/19 12:42 PM   Specimen: BLOOD  Result Value Ref Range Status   Specimen Description BLOOD R ARM MEDIAL  Final   Special Requests   Final    BOTTLES DRAWN AEROBIC AND ANAEROBIC Blood Culture adequate volume   Culture   Final    NO GROWTH 4 DAYS Performed at [facility], [address], [place], [place] [address]    Report Status PENDING  Incomplete  Blood Culture (routine x 2)     Status: None (Preliminary result)   Collection Time: 03/23/19 12:42 PM   Specimen: BLOOD  Result Value Ref Range Status   Specimen Description BLOOD R ARM LATERAL  Final   Special Requests   Final    BOTTLES DRAWN AEROBIC AND ANAEROBIC Blood Culture adequate volume  Culture   Final    NO GROWTH 4 DAYS Performed at [facility], [address] [place], [place] [address]    Report Status PENDING  Incomplete  SARS CORONAVIRUS 2 (TAT 6-24 HRS) Nasopharyngeal     Status: None   Collection Time: 03/23/19 12:42 PM   Specimen: Nasopharyngeal  Result Value Ref Range Status   SARS Coronavirus 2 NEGATIVE NEGATIVE Final    Comment: (NOTE) SARS-CoV-2 target nucleic acids are NOT DETECTED. The SARS-CoV-2 RNA is generally detectable in upper and lower respiratory specimens during the acute phase of infection. Negative results do not preclude SARS-CoV-2 infection, do not rule out co-infections with other pathogens, and should not be used as the sole basis for treatment or other patient management decisions. Negative results must be combined with clinical observations, patient history, and epidemiological information. The expected result is Negative. Fact Sheet for Patients: SugarRoll.be Fact Sheet for Healthcare Providers: https://www.woods-mathews.com/ This test is not yet approved or cleared by the [place] FDA and  has been authorized for detection and/or diagnosis of SARS-CoV-2 by FDA under an Emergency Use Authorization (EUA). This EUA will remain  in effect (meaning this test can be used) for the duration of the COVID-19 declaration under Section 56 4(b)(1) of the Act, 21 U.S.C. section 360bbb-3(b)(1), unless the authorization is terminated or revoked sooner. Performed at [facility], [place] [address], [place], [place] [address]     RADIOLOGY:  No results found.    Management plans discussed with the patient, family and they are in agreement.  CODE STATUS:  Code Status History    Date Active Date Inactive Code Status Order ID Comments User Context   03/23/2019 1348 03/25/2019 1634 Full Code [id number]  Saundra Shelling, MD Inpatient   Advance Care Planning Activity      TOTAL TIME TAKING CARE OF THIS PATIENT: 40 minutes.    Henreitta Leber M.D on 03/27/2019 at 3:27 PM  Between 7am to 6pm - Pager - [phone]  After 6pm go to [email or web address] - Proofreader  [facility]  Office  [phone]  CC: Primary care physician; Rusty Aus, MD

## 2019-03-28 LAB — CULTURE, BLOOD (ROUTINE X 2)
Culture: NO GROWTH
Culture: NO GROWTH
Special Requests: ADEQUATE
Special Requests: ADEQUATE

## 2019-11-20 ENCOUNTER — Emergency Department: Payer: Self-pay

## 2019-11-20 ENCOUNTER — Other Ambulatory Visit: Payer: Self-pay

## 2019-11-20 ENCOUNTER — Observation Stay
Admit: 2019-11-20 | Discharge: 2019-11-20 | Disposition: A | Discharge status: Home / Self Care | Payer: Self-pay | Attending: Physician Assistant | Admitting: Physician Assistant

## 2019-11-20 ENCOUNTER — Observation Stay
Admission: EM | Admit: 2019-11-20 | Discharge: 2019-11-21 | Disposition: A | Discharge status: Home / Self Care | Payer: Self-pay | Attending: Internal Medicine | Admitting: Internal Medicine

## 2019-11-20 ENCOUNTER — Encounter: Payer: Self-pay | Admitting: Emergency Medicine

## 2019-11-20 DIAGNOSIS — F17210 Nicotine dependence, cigarettes, uncomplicated: Secondary | ICD-10-CM | POA: Insufficient documentation

## 2019-11-20 DIAGNOSIS — Z79899 Other long term (current) drug therapy: Secondary | ICD-10-CM | POA: Insufficient documentation

## 2019-11-20 DIAGNOSIS — K219 Gastro-esophageal reflux disease without esophagitis: Secondary | ICD-10-CM | POA: Diagnosis present

## 2019-11-20 DIAGNOSIS — Z20822 Contact with and (suspected) exposure to covid-19: Secondary | ICD-10-CM | POA: Insufficient documentation

## 2019-11-20 DIAGNOSIS — E119 Type 2 diabetes mellitus without complications: Secondary | ICD-10-CM | POA: Insufficient documentation

## 2019-11-20 DIAGNOSIS — R7989 Other specified abnormal findings of blood chemistry: Secondary | ICD-10-CM | POA: Diagnosis present

## 2019-11-20 DIAGNOSIS — R778 Other specified abnormalities of plasma proteins: Secondary | ICD-10-CM | POA: Insufficient documentation

## 2019-11-20 DIAGNOSIS — R079 Chest pain, unspecified: Principal | ICD-10-CM | POA: Diagnosis present

## 2019-11-20 DIAGNOSIS — I10 Essential (primary) hypertension: Secondary | ICD-10-CM | POA: Insufficient documentation

## 2019-11-20 DIAGNOSIS — Z7982 Long term (current) use of aspirin: Secondary | ICD-10-CM | POA: Insufficient documentation

## 2019-11-20 DIAGNOSIS — Z720 Tobacco use: Secondary | ICD-10-CM | POA: Diagnosis present

## 2019-11-20 DIAGNOSIS — R0600 Dyspnea, unspecified: Secondary | ICD-10-CM | POA: Insufficient documentation

## 2019-11-20 DIAGNOSIS — Z7984 Long term (current) use of oral hypoglycemic drugs: Secondary | ICD-10-CM | POA: Insufficient documentation

## 2019-11-20 DIAGNOSIS — Z85528 Personal history of other malignant neoplasm of kidney: Secondary | ICD-10-CM | POA: Insufficient documentation

## 2019-11-20 DIAGNOSIS — R110 Nausea: Secondary | ICD-10-CM | POA: Insufficient documentation

## 2019-11-20 DIAGNOSIS — G4730 Sleep apnea, unspecified: Secondary | ICD-10-CM | POA: Insufficient documentation

## 2019-11-20 DIAGNOSIS — R0609 Other forms of dyspnea: Secondary | ICD-10-CM

## 2019-11-20 DIAGNOSIS — M1990 Unspecified osteoarthritis, unspecified site: Secondary | ICD-10-CM | POA: Insufficient documentation

## 2019-11-20 DIAGNOSIS — Z791 Long term (current) use of non-steroidal anti-inflammatories (NSAID): Secondary | ICD-10-CM | POA: Insufficient documentation

## 2019-11-20 LAB — TROPONIN I (HIGH SENSITIVITY)
Troponin I (High Sensitivity): 27 ng/L — ABNORMAL HIGH (ref <18)
Troponin I (High Sensitivity): 30 ng/L — ABNORMAL HIGH (ref <18)
Troponin I (High Sensitivity): 47 ng/L — ABNORMAL HIGH (ref <18)
Troponin I (High Sensitivity): 48 ng/L — ABNORMAL HIGH (ref <18)
Troponin I (High Sensitivity): 59 ng/L — ABNORMAL HIGH (ref <18)

## 2019-11-20 LAB — CBC WITH DIFFERENTIAL/PLATELET
Abs Immature Granulocytes: 0.02 10*3/uL (ref 0.00–0.07)
Basophils Absolute: 0.1 10*3/uL (ref 0.0–0.1)
Basophils Relative: 1 %
Eosinophils Absolute: 0.2 10*3/uL (ref 0.0–0.5)
Eosinophils Relative: 3 %
HCT: 40.5 % (ref 36.0–46.0)
Hemoglobin: 13 g/dL (ref 12.0–15.0)
Immature Granulocytes: 0 %
Lymphocytes Relative: 38 %
Lymphs Abs: 3 10*3/uL (ref 0.7–4.0)
MCH: 27.8 pg (ref 26.0–34.0)
MCHC: 32.1 g/dL (ref 30.0–36.0)
MCV: 86.5 fL (ref 80.0–100.0)
Monocytes Absolute: 0.8 10*3/uL (ref 0.1–1.0)
Monocytes Relative: 10 %
Neutro Abs: 3.7 10*3/uL (ref 1.7–7.7)
Neutrophils Relative %: 48 %
Platelets: 178 10*3/uL (ref 150–400)
RBC: 4.68 MIL/uL (ref 3.87–5.11)
RDW: 14.7 % (ref 11.5–15.5)
WBC: 7.8 10*3/uL (ref 4.0–10.5)
nRBC: 0 % (ref 0.0–0.2)

## 2019-11-20 LAB — URINE DRUG SCREEN, QUALITATIVE (ARMC ONLY)
Amphetamines, Ur Screen: NOT DETECTED
Barbiturates, Ur Screen: NOT DETECTED
Benzodiazepine, Ur Scrn: NOT DETECTED
Cannabinoid 50 Ng, Ur ~~LOC~~: NOT DETECTED
Cocaine Metabolite,Ur ~~LOC~~: NOT DETECTED
MDMA (Ecstasy)Ur Screen: NOT DETECTED
Methadone Scn, Ur: NOT DETECTED
Opiate, Ur Screen: NOT DETECTED
Phencyclidine (PCP) Ur S: NOT DETECTED
Tricyclic, Ur Screen: NOT DETECTED

## 2019-11-20 LAB — COMPREHENSIVE METABOLIC PANEL
ALT: 38 U/L (ref 0–44)
AST: 38 U/L (ref 15–41)
Albumin: 3.7 g/dL (ref 3.5–5.0)
Alkaline Phosphatase: 59 U/L (ref 38–126)
Anion gap: 10 (ref 5–15)
BUN: 12 mg/dL (ref 6–20)
CO2: 29 mmol/L (ref 22–32)
Calcium: 9.2 mg/dL (ref 8.9–10.3)
Chloride: 99 mmol/L (ref 98–111)
Creatinine, Ser: 0.85 mg/dL (ref 0.44–1.00)
GFR calc Af Amer: >60 mL/min (ref >60)
GFR calc non Af Amer: >60 mL/min (ref >60)
Glucose, Bld: 271 mg/dL — ABNORMAL HIGH (ref 70–99)
Potassium: 4.1 mmol/L (ref 3.5–5.1)
Sodium: 138 mmol/L (ref 135–145)
Total Bilirubin: 0.5 mg/dL (ref 0.3–1.2)
Total Protein: 7.3 g/dL (ref 6.5–8.1)

## 2019-11-20 LAB — GLUCOSE, CAPILLARY
Glucose-Capillary: 136 mg/dL — ABNORMAL HIGH (ref 70–99)
Glucose-Capillary: 217 mg/dL — ABNORMAL HIGH (ref 70–99)
Glucose-Capillary: 130 mg/dL — ABNORMAL HIGH (ref 70–99)

## 2019-11-20 LAB — BRAIN NATRIURETIC PEPTIDE: B Natriuretic Peptide: 18.1 pg/mL (ref 0.0–100.0)

## 2019-11-20 LAB — SARS CORONAVIRUS 2 BY RT PCR (HOSPITAL ORDER, PERFORMED IN ~~LOC~~ HOSPITAL LAB): SARS Coronavirus 2: NEGATIVE

## 2019-11-20 MED ORDER — MORPHINE SULFATE (PF) 2 MG/ML IV SOLN: 2.0000 mg | INTRAVENOUS | Status: DC | PRN

## 2019-11-20 MED ORDER — HYDRALAZINE HCL 20 MG/ML IJ SOLN: 5.0000 mg | INTRAMUSCULAR | Status: DC | PRN

## 2019-11-20 MED ORDER — ENOXAPARIN SODIUM 40 MG/0.4ML ~~LOC~~ SOLN
40.0000 mg | SUBCUTANEOUS | Status: DC
  Administered 2019-11-20: 40 mg via SUBCUTANEOUS
  Filled 2019-11-20: qty 0.4

## 2019-11-20 MED ORDER — VITAMIN B-12 1000 MCG PO TABS
2000.0000 ug | ORAL_TABLET | Freq: Every day | ORAL | Status: DC
  Administered 2019-11-20 – 2019-11-21 (×2): 2000 ug via ORAL
  Filled 2019-11-20 (×2): qty 2

## 2019-11-20 MED ORDER — NICOTINE 21 MG/24HR TD PT24: 21.0000 mg | MEDICATED_PATCH | Freq: Every day | TRANSDERMAL | Status: DC

## 2019-11-20 MED ORDER — FUROSEMIDE 20 MG PO TABS
20.0000 mg | ORAL_TABLET | Freq: Every day | ORAL | Status: DC
  Administered 2019-11-20 – 2019-11-21 (×2): 20 mg via ORAL
  Filled 2019-11-20 (×2): qty 1

## 2019-11-20 MED ORDER — ALBUTEROL SULFATE (2.5 MG/3ML) 0.083% IN NEBU: 2.5000 mg | INHALATION_SOLUTION | RESPIRATORY_TRACT | Status: DC | PRN

## 2019-11-20 MED ORDER — ACETAMINOPHEN 325 MG PO TABS: 650.0000 mg | ORAL_TABLET | ORAL | Status: DC | PRN

## 2019-11-20 MED ORDER — INSULIN ASPART 100 UNIT/ML ~~LOC~~ SOLN
0.0000 [IU] | Freq: Three times a day (TID) | SUBCUTANEOUS | Status: DC
  Administered 2019-11-20: 1 [IU] via SUBCUTANEOUS
  Administered 2019-11-20 – 2019-11-21 (×2): 3 [IU] via SUBCUTANEOUS
  Filled 2019-11-20 (×3): qty 1

## 2019-11-20 MED ORDER — ATORVASTATIN CALCIUM 20 MG PO TABS
40.0000 mg | ORAL_TABLET | Freq: Every day | ORAL | Status: DC
  Administered 2019-11-20 – 2019-11-21 (×2): 40 mg via ORAL
  Filled 2019-11-20 (×2): qty 2

## 2019-11-20 MED ORDER — ONDANSETRON HCL 4 MG/2ML IJ SOLN: 4.0000 mg | Freq: Four times a day (QID) | INTRAMUSCULAR | Status: DC | PRN

## 2019-11-20 MED ORDER — PANTOPRAZOLE SODIUM 40 MG PO TBEC
40.0000 mg | DELAYED_RELEASE_TABLET | Freq: Every day | ORAL | Status: DC
  Administered 2019-11-21: 40 mg via ORAL
  Filled 2019-11-20 (×2): qty 1

## 2019-11-20 MED ORDER — MAGNESIUM OXIDE 400 (241.3 MG) MG PO TABS
400.0000 mg | ORAL_TABLET | Freq: Every day | ORAL | Status: DC
  Administered 2019-11-21: 400 mg via ORAL
  Filled 2019-11-20: qty 1

## 2019-11-20 MED ORDER — PRAMIPEXOLE DIHYDROCHLORIDE 0.25 MG PO TABS
0.2500 mg | ORAL_TABLET | Freq: Two times a day (BID) | ORAL | Status: DC
  Administered 2019-11-20 – 2019-11-21 (×2): 0.25 mg via ORAL
  Filled 2019-11-20 (×3): qty 1

## 2019-11-20 MED ORDER — LISINOPRIL 20 MG PO TABS
20.0000 mg | ORAL_TABLET | Freq: Every day | ORAL | Status: DC
  Administered 2019-11-21: 20 mg via ORAL
  Filled 2019-11-20: qty 2
  Filled 2019-11-20: qty 1

## 2019-11-20 MED ORDER — NITROGLYCERIN 0.4 MG SL SUBL: 0.4000 mg | SUBLINGUAL_TABLET | SUBLINGUAL | Status: DC | PRN

## 2019-11-20 MED ORDER — ASPIRIN 81 MG PO CHEW
324.0000 mg | CHEWABLE_TABLET | Freq: Once | ORAL | Status: AC
  Administered 2019-11-20: 324 mg via ORAL
  Filled 2019-11-20: qty 4

## 2019-11-20 MED ORDER — ASPIRIN EC 81 MG PO TBEC
81.0000 mg | DELAYED_RELEASE_TABLET | Freq: Every day | ORAL | Status: DC
  Administered 2019-11-21: 81 mg via ORAL
  Filled 2019-11-20: qty 1

## 2019-11-20 MED ORDER — INSULIN ASPART 100 UNIT/ML ~~LOC~~ SOLN: 0.0000 [IU] | Freq: Every day | SUBCUTANEOUS | Status: DC

## 2019-11-20 NOTE — ED Notes (Signed)
Pt walked to bathroom, gait steady, no c/o dizziness.

## 2019-11-20 NOTE — ED Notes (Signed)
Patient refused covid swab

## 2019-11-20 NOTE — ED Notes (Signed)
Pt with O2 sats dropping while pt asleep, down to 86% on room air. Pt placed on O2  2 L .

## 2019-11-20 NOTE — ED Notes (Signed)
Pt provided with lunch tray and fluids at this time.

## 2019-11-20 NOTE — H&P (Signed)
History and Physical    Martha Hill QPR:[id number] DOB: 1965/06/16 DOA: 11/20/2019  Referring MD/NP/PA:   PCP: Rusty Aus, MD   Patient coming from:  The patient is coming from home.  At baseline, pt is independent for most of ADL.        Chief Complaint: chest pain  HPI: Martha Hill is a 54 y.o. female with medical history significant of hypertension, diabetes mellitus, GERD, gout, OSA, renal cancer, tobacco abuse, who presents with chest pain.  Her chest pain started in the early morning, which is located in central chest, moderate, pressure, radiating to the left arm and jaw.  She has mild dry cough and shortness of breath, which is worse on exertion.  Denies fever or chills.  She has nausea, but no vomiting, diarrhea, abdominal pain, symptoms of UTI or unilateral weakness.   ED Course: pt was found to have troponin 22 --> 30 -->47, WBC 7.8, pending COVID-19 PCR, electrolytes renal function okay, temperature normal, blood pressure 119/66, heart rate 80s, RR 18, oxygen saturation 95% on room air.  Chest x-ray negative.  Patient is placed on progressive bed for observation.  Cardiology, Dr. Saralyn Pilar is consulted.  Review of Systems:   General: no fevers, chills, no body weight gain, has fatigue HEENT: no blurry vision, hearing changes or sore throat Respiratory: has dyspnea, coughing, no wheezing CV: has chest pain, no palpitations GI: has nausea, no vomiting, abdominal pain, diarrhea, constipation GU: no dysuria, burning on urination, increased urinary frequency, hematuria  Ext: has trace leg edema Neuro: no unilateral weakness, numbness, or tingling, no vision change or hearing loss Skin: no rash, no skin tear. MSK: No muscle spasm, no deformity, no limitation of range of movement in spin Heme: No easy bruising.  Travel history: No recent long distant travel.  Allergy:  Allergies  Allergen Reactions  . Eucalyptus Flavor [Flavoring Agent] Shortness Of Breath  .  Macrolides And Ketolides Nausea And Vomiting    Azithromycin/erythromycin  . Salmon Oil [Nutritional Supplements] Swelling  . Sulfa Antibiotics Other (See Comments)    Contact dermatitis due to intravaginal preparation  . Tramadol Hcl   . Zithromax [Azithromycin]     Past Medical History:  Diagnosis Date  . Arthritis   . Diabetes mellitus without complication ([place])   . GERD (gastroesophageal reflux disease)   . Gout   . Hypertension   . Renal cancer ([place]) 2008  . Sleep apnea     Past Surgical History:  Procedure Laterality Date  . ABDOMINAL HYSTERECTOMY    . CHOLECYSTECTOMY    . COLONOSCOPY WITH PROPOFOL N/A 01/15/2018   Procedure: COLONOSCOPY WITH PROPOFOL;  Surgeon: Lollie Sails, MD;  Location: [facility] ENDOSCOPY;  Service: Endoscopy;  Laterality: N/A;  . COLONOSCOPY WITH PROPOFOL N/A 02/11/2018   Procedure: COLONOSCOPY WITH PROPOFOL;  Surgeon: Lollie Sails, MD;  Location: [facility] ENDOSCOPY;  Service: Endoscopy;  Laterality: N/A;  . ROBOTIC ASSITED PARTIAL NEPHRECTOMY Left 2008  . TONSILLECTOMY      Social History:  reports that she has been smoking cigarettes. She has been smoking about 1.00 pack per day. She has never used smokeless tobacco. She reports that she does not drink alcohol and does not use drugs.  Family History:  Family History  Problem Relation Age of Onset  . Breast cancer Maternal Aunt 79  . Breast cancer Maternal Grandmother 70     Prior to Admission medications   Medication Sig Start Date End Date Taking? Authorizing Provider  albuterol (PROVENTIL HFA;VENTOLIN HFA) 108 (90 Base) MCG/ACT inhaler Inhale into the lungs every 6 (six) hours as needed for wheezing or shortness of breath.    [provider]  cyanocobalamin 2000 MCG tablet Take 1 tablet (2,000 mcg total) by mouth daily. 03/25/19   Henreitta Leber, MD  furosemide (LASIX) 20 MG tablet Take 20 mg by mouth daily. 11/20/18   [provider]  glimepiride (AMARYL) 2 MG tablet  Take 2 mg by mouth daily. 03/10/19   [provider]  HYDROcodone-acetaminophen (NORCO/VICODIN) 5-325 MG tablet Take 1 tablet by mouth every 4 (four) hours as needed.  06/25/17   [provider]  lisinopril (PRINIVIL,ZESTRIL) 10 MG tablet Take 10 mg by mouth.    [provider]  meloxicam (MOBIC) 7.5 MG tablet Take 7.5 mg by mouth daily. 02/19/19 02/19/20  [provider]  metFORMIN (GLUCOPHAGE) 500 MG tablet TAKE ONE TABLET BY MOUTH TWICE DAILY BEFORE MEAL(S) 02/12/17   [provider]  omeprazole (PRILOSEC) 20 MG capsule Take 1 capsule (20 mg total) by mouth 2 (two) times daily. 03/25/19   Henreitta Leber, MD  phenazopyridine (PYRIDIUM) 100 MG tablet Take 1 tablet (100 mg total) by mouth 3 (three) times daily with meals. 03/25/19   Henreitta Leber, MD  potassium chloride (K-DUR) 10 MEQ tablet TAKE ONE TABLET BY MOUTH THREE TIMES DAILY 08/23/16   [provider]  pramipexole (MIRAPEX) 0.25 MG tablet Take 0.25 mg by mouth at bedtime. 12/18/18   [provider]    Physical Exam: Vitals:   11/20/19 0649 11/20/19 1133 11/20/19 1251 11/20/19 1424  BP: 119/66 (!) 145/71 (!) 142/56 124/69  Pulse: 80 80 91 91  Resp: 18 20 19 20   Temp:   97.7 F (36.5 C)   TempSrc:   Oral   SpO2: 97% 97% 98% 100%  Weight:      Height:       General: Not in acute distress HEENT:       Eyes: PERRL, EOMI, no scleral icterus.       ENT: No discharge from the ears and nose, no pharynx injection, no tonsillar enlargement.        Neck: No JVD, no bruit, no mass felt. Heme: No neck lymph node enlargement. Cardiac: S1/S2, RRR, No murmurs, No gallops or rubs. Respiratory: No rales, wheezing, rhonchi or rubs. GI: Soft, nondistended, nontender, no rebound pain, no organomegaly, BS present. GU: No hematuria Ext: has trace leg edema bilaterally. 1+DP/PT pulse bilaterally. Musculoskeletal: No joint deformities, No joint redness or warmth, no limitation of ROM in  spin. Skin: No rashes.  Neuro: Alert, oriented X3, cranial nerves II-XII grossly intact, moves all extremities normally. Psych: Patient is not psychotic, no suicidal or hemocidal ideation.  Labs on Admission: I have personally reviewed following labs and imaging studies  CBC: Recent Labs  Lab 11/20/19 0438  WBC 7.8  NEUTROABS 3.7  HGB 13.0  HCT 40.5  MCV 86.5  PLT 161   Basic Metabolic Panel: Recent Labs  Lab 11/20/19 0438  NA 138  K 4.1  CL 99  CO2 29  GLUCOSE 271*  BUN 12  CREATININE 0.85  CALCIUM 9.2   GFR: Estimated Creatinine Clearance: 86.3 mL/min (by C-G formula based on SCr of 0.85 mg/dL). Liver Function Tests: Recent Labs  Lab 11/20/19 0438  AST 38  ALT 38  ALKPHOS 59  BILITOT 0.5  PROT 7.3  ALBUMIN 3.7   No results for input(s): LIPASE, AMYLASE in  the last 168 hours. No results for input(s): AMMONIA in the last 168 hours. Coagulation Profile: No results for input(s): INR, PROTIME in the last 168 hours. Cardiac Enzymes: No results for input(s): CKTOTAL, CKMB, CKMBINDEX, TROPONINI in the last 168 hours. BNP (last 3 results) No results for input(s): PROBNP in the last 8760 hours. HbA1C: No results for input(s): HGBA1C in the last 72 hours. CBG: Recent Labs  Lab 11/20/19 1209  GLUCAP 136*   Lipid Profile: No results for input(s): CHOL, HDL, LDLCALC, TRIG, CHOLHDL, LDLDIRECT in the last 72 hours. Thyroid Function Tests: No results for input(s): TSH, T4TOTAL, FREET4, T3FREE, THYROIDAB in the last 72 hours. Anemia Panel: No results for input(s): VITAMINB12, FOLATE, FERRITIN, TIBC, IRON, RETICCTPCT in the last 72 hours. Urine analysis:    Component Value Date/Time   COLORURINE AMBER (A) 03/23/2019 1018   APPEARANCEUR CLOUDY (A) 03/23/2019 1018   APPEARANCEUR Clear 09/25/2017 1103   LABSPEC 1.010 03/23/2019 1018   LABSPEC 1.009 06/10/2014 1237   PHURINE 7.0 03/23/2019 1018   GLUCOSEU 50 (A) 03/23/2019 1018   GLUCOSEU Negative 06/10/2014  1237   HGBUR MODERATE (A) 03/23/2019 1018   BILIRUBINUR NEGATIVE 03/23/2019 1018   BILIRUBINUR Negative 09/25/2017 1103   BILIRUBINUR Negative 06/10/2014 1237   KETONESUR NEGATIVE 03/23/2019 1018   PROTEINUR 100 (A) 03/23/2019 1018   NITRITE POSITIVE (A) 03/23/2019 1018   LEUKOCYTESUR LARGE (A) 03/23/2019 1018   LEUKOCYTESUR Negative 06/10/2014 1237   Sepsis Labs: @LABRCNTIP (procalcitonin:4,lacticidven:4) )No results found for this or any previous visit (from the past 240 hour(s)).   Radiological Exams on Admission: DG Chest 2 View  Result Date: 11/20/2019 CLINICAL DATA:  Chest pain EXAM: CHEST - 2 VIEW COMPARISON:  April 21, 2017 FINDINGS: The heart size and mediastinal contours are within normal limits. Both lungs are clear. The visualized skeletal structures are unremarkable. IMPRESSION: No active cardiopulmonary disease. Electronically Signed   By: Prudencio Pair M.D.   On: 11/20/2019 05:11     EKG: Independently reviewed.  Sinus rhythm, QTC 457, LAE, low voltage, nonspecific T wave change  Assessment/Plan Principal Problem:   Chest pain Active Problems:   Elevated troponin   Hypertension   GERD (gastroesophageal reflux disease)   Tobacco abuse   Diabetes mellitus without complication (HCC)   Chest pain and elevated trop:  Trop 27 -->30 -->47 -->48. No oxygen desaturation.  Chest pain is pressure-like, not pleuritic, low suspicions of for PE. Dr. Saralyn Pilar of cardiology is consulted.  - place to progressive unit for observation - Trend Trop - Repeat EKG in the am  - prn Nitroglycerin, Morphine - Start aspirin, lipitor  - Risk factor stratification: will check FLP and A1C  - check UDS - 2d echo  HTN:  -Continue home medications: Lasix, lisinopril, -hydralazine prn  GERD (gastroesophageal reflux disease) -Protonix  Tobacco abuse: -Did counseling about importance of quitting smoking -Nicotine patch  Diabetes mellitus without complication: Most recent A1c  6.8,  well controled. Patient is taking Metformin and Amaryl at home -SSI   DVT ppx: SQ Lovenox Code Status: Full code Family Communication:  Yes, patient's mother  at bed side Disposition Plan:  Anticipate discharge back to previous environment Consults called:   Cardiology, Dr. Saralyn Pilar is consulted. Admission status:   progressive unit for obs      Status is: Observation  The patient remains OBS appropriate and will d/c before 2 midnights.  Dispo: The patient is from: Home  Anticipated d/c is to: Home              Anticipated d/c date is: 1 day              Patient currently is not medically stable to d/c.           Date of Service 11/20/2019    Ivor Costa [facility]   If 7PM-7AM, please contact night-coverage [email or web address] 11/20/2019, 4:49 PM

## 2019-11-20 NOTE — ED Notes (Signed)
Cardiology at bedside.

## 2019-11-20 NOTE — Consult Note (Signed)
CARDIOLOGY CONSULT NOTE               Patient ID: Martha Hill MRN: [id number] DOB/AGE: Apr 24, 1966 54 y.o.  Admit date: 11/20/2019 Referring Physician Ivor Costa, MD Primary Physician Emily Filbert, MD Primary Cardiologist none per patient Reason for Consultation: chest pain, elevated troponin  HPI: 54 year old female referred for evaluation of chest pain. The patient has a history of tobacco abuse (1ppd since she was a teenager), hypertension, type II diabetes, and OSA, noncompliant with CPAP.  The patient reports 2 recent episodes of nonexertional chest pain, one occurring last week, and the other occurring early this morning.  She states she was playing a game on her phone around 2 AM when she experienced sudden onset of left-sided chest tightness with radiation to her neck, described as a gripping sensation, lasting about 5 minutes without nausea or diaphoresis.  She walked to her granddaughter's room to ask her to take her to the ER, and developed shortness of breath.  The patient denies any recent exertional chest pain, palpitations, or worsening chronic exertional dyspnea.  She denies a history of MI, heart failure, cardiac catheterization or stents.  The patient reports that she had recurrent chest pain while in the ER waiting room, but has had no recurrence.  ECG reveals normal sinus rhythm at a rate of 88 bpm without evidence of ischemia.  Admission labs are notable for borderline elevated high-sensitivity troponin (27, 30, 47), without electrolyte abnormalities, renal dysfunction or anemia.  Chest x-ray negative for acute cardiopulmonary disease.  The patient reports chronic lower extremity edema, right greater than left, for which she takes Lasix.  She denies any worsening peripheral edema recently.  Currently, the patient reports feeling well with no complaints.  Review of systems complete and found to be negative unless listed above     Past Medical History:  Diagnosis Date    . Arthritis   . Diabetes mellitus without complication ([place])   . GERD (gastroesophageal reflux disease)   . Gout   . Hypertension   . Renal cancer ([place]) 2008  . Sleep apnea     Past Surgical History:  Procedure Laterality Date  . ABDOMINAL HYSTERECTOMY    . CHOLECYSTECTOMY    . COLONOSCOPY WITH PROPOFOL N/A 01/15/2018   Procedure: COLONOSCOPY WITH PROPOFOL;  Surgeon: Lollie Sails, MD;  Location: [facility] ENDOSCOPY;  Service: Endoscopy;  Laterality: N/A;  . COLONOSCOPY WITH PROPOFOL N/A 02/11/2018   Procedure: COLONOSCOPY WITH PROPOFOL;  Surgeon: Lollie Sails, MD;  Location: [facility] ENDOSCOPY;  Service: Endoscopy;  Laterality: N/A;  . ROBOTIC ASSITED PARTIAL NEPHRECTOMY Left 2008  . TONSILLECTOMY      (Not in a hospital admission)  Social History   Socioeconomic History  . Marital status: Legally Separated    Spouse name: Not on file  . Number of children: Not on file  . Years of education: Not on file  . Highest education level: Not on file  Occupational History  . Not on file  Tobacco Use  . Smoking status: Current Every Day Smoker    Packs/day: 1.00    Types: Cigarettes  . Smokeless tobacco: Never Used  Vaping Use  . Vaping Use: Never used  Substance and Sexual Activity  . Alcohol use: No  . Drug use: No  . Sexual activity: Not on file  Other Topics Concern  . Not on file  Social History Narrative  . Not on file   Social Determinants of Health   Financial  Resource Strain:   . Difficulty of Paying Living Expenses:   Food Insecurity:   . Worried About Charity fundraiser in the Last Year:   . Arboriculturist in the Last Year:   Transportation Needs:   . Film/video editor (Medical):   Marland Kitchen Lack of Transportation (Non-Medical):   Physical Activity:   . Days of Exercise per Week:   . Minutes of Exercise per Session:   Stress:   . Feeling of Stress :   Social Connections:   . Frequency of Communication with Friends and Family:   . Frequency of Social  Gatherings with Friends and Family:   . Attends Religious Services:   . Active Member of Clubs or Organizations:   . Attends Archivist Meetings:   Marland Kitchen Marital Status:   Intimate Partner Violence:   . Fear of Current or Ex-Partner:   . Emotionally Abused:   Marland Kitchen Physically Abused:   . Sexually Abused:     Family History  Problem Relation Age of Onset  . Breast cancer Maternal Aunt 49  . Breast cancer Maternal Grandmother 70      Review of systems complete and found to be negative unless listed above      PHYSICAL EXAM  General: Well developed, well nourished, in no acute distress, lying in bed HEENT:  Normocephalic and atramatic Neck:  No JVD.  Lungs: Clear bilaterally to auscultation, normal effort of breathing on room air Heart: HRRR . Normal S1 and S2 without gallops or murmurs.  Abdomen: Obese, nondistended Msk: Gait not assessed.  No obvious deformities Extremities: Mild bilateral lower extremity edema, right greater than left Neuro: Alert and oriented X 3. Psych:  Good affect, responds appropriately  Labs:   Lab Results  Component Value Date   WBC 7.8 11/20/2019   HGB 13.0 11/20/2019   HCT 40.5 11/20/2019   MCV 86.5 11/20/2019   PLT 178 11/20/2019    Recent Labs  Lab 11/20/19 0438  NA 138  K 4.1  CL 99  CO2 29  BUN 12  CREATININE 0.85  CALCIUM 9.2  PROT 7.3  BILITOT 0.5  ALKPHOS 59  ALT 38  AST 38  GLUCOSE 271*   Lab Results  Component Value Date   TROPONINI < 0.02 06/10/2014   No results found for: CHOL No results found for: HDL No results found for: LDLCALC No results found for: TRIG No results found for: CHOLHDL No results found for: LDLDIRECT    Radiology: DG Chest 2 View  Result Date: 11/20/2019 CLINICAL DATA:  Chest pain EXAM: CHEST - 2 VIEW COMPARISON:  April 21, 2017 FINDINGS: The heart size and mediastinal contours are within normal limits. Both lungs are clear. The visualized skeletal structures are unremarkable.  IMPRESSION: No active cardiopulmonary disease. Electronically Signed   By: Prudencio Pair M.D.   On: 11/20/2019 05:11    EKG: Normal sinus rhythm without evidence of ischemia  ASSESSMENT AND PLAN:  1.  Chest pain, with typical and atypical features, with borderline elevated high-sensitivity troponin (27, 30, 47) with normal ECG with multiple cardiovascular risk factors. Currently chest pain free.  2. Type II diabetes 3. Hyperlipidemia 4. Hypertension 5. Tobacco abuse  Recommendations: 1. Continue to trend troponin 2. 2D echocardiogram 3. Continue aspirin, atorvastatin, lisinopril 20 mg, Lasix 20 mg  4.  Lexiscan Myoview in the morning if patient remains chest pain free and troponin remains stable. NPO after midnight.  Signed: Clabe Seal PA-C 11/20/2019, 2:00  PM

## 2019-11-20 NOTE — ED Notes (Addendum)
ED Provider Monks at bedside. EDP Monks informing pt regarding COVID swab for admission. Pt  refusing COVID swab at this time.

## 2019-11-20 NOTE — ED Triage Notes (Signed)
Patient ambulatory to triage with steady gait, without difficulty or distress noted; pt reports left sided CP radiating into left arm & jaw accomp by nausea this am

## 2019-11-20 NOTE — Progress Notes (Signed)
*  PRELIMINARY RESULTS* Echocardiogram 2D Echocardiogram has been performed.  Sherrie Sport 11/20/2019, 3:13 PM

## 2019-11-20 NOTE — ED Notes (Signed)
Pt refuses COVID swab at this time. Mother at bedside.

## 2019-11-20 NOTE — ED Notes (Signed)
Pt refused covid swab at first, pt then agreed

## 2019-11-20 NOTE — ED Notes (Signed)
Pt denies having any chest pain at this time. Pt's O2 sats dropping to 88-90 % on room air when sleeping. Pt states she has a c-pap at home but does not use it. Pt with O2 sats of 95-96% on room air while awake.

## 2019-11-20 NOTE — ED Provider Notes (Signed)
[facility] Emergency Department Provider Note  ____________________________________________   First MD Initiated Contact with Patient 11/20/19 1105     (approximate)  I have reviewed the triage vital signs and the nursing notes.  History  Chief Complaint Chest Pain    HPI Martha Hill is a 54 y.o. female past medical history as below, including HTN, DM, tobacco use who presents to the ER for chest pain.  Pain started last night, has improved since initial onset, currently mild in severity.  Located to the center of the chest, radiates to her jaw into her left arm.  Describes it as a tightness.  Associated with some nausea, and shortness of breath with exertion.  She reports a few episodes of similar symptoms previously, but never this severe.  Denies any known cardiac history.  No fever or cough.  No sick contacts.  Has not been vaccinated against COVID.   Past Medical Hx Past Medical History:  Diagnosis Date   Arthritis    Diabetes mellitus without complication ([place])    GERD (gastroesophageal reflux disease)    Gout    Hypertension    Renal cancer ([place]) 2008   Sleep apnea     Problem List Patient Active Problem List   Diagnosis Date Noted   Acute pyelonephritis 03/23/2019   Sepsis ([place]) 03/23/2019    Past Surgical Hx Past Surgical History:  Procedure Laterality Date   ABDOMINAL HYSTERECTOMY     CHOLECYSTECTOMY     COLONOSCOPY WITH PROPOFOL N/A 01/15/2018   Procedure: COLONOSCOPY WITH PROPOFOL;  Surgeon: Lollie Sails, MD;  Location: [facility] ENDOSCOPY;  Service: Endoscopy;  Laterality: N/A;   COLONOSCOPY WITH PROPOFOL N/A 02/11/2018   Procedure: COLONOSCOPY WITH PROPOFOL;  Surgeon: Lollie Sails, MD;  Location: [facility] ENDOSCOPY;  Service: Endoscopy;  Laterality: N/A;   ROBOTIC ASSITED PARTIAL NEPHRECTOMY Left 2008   TONSILLECTOMY      Medications Prior to Admission medications   Medication Sig Start Date End Date  Taking? Authorizing Provider  albuterol (PROVENTIL HFA;VENTOLIN HFA) 108 (90 Base) MCG/ACT inhaler Inhale into the lungs every 6 (six) hours as needed for wheezing or shortness of breath.    [provider]  cyanocobalamin 2000 MCG tablet Take 1 tablet (2,000 mcg total) by mouth daily. 03/25/19   Henreitta Leber, MD  furosemide (LASIX) 20 MG tablet Take 20 mg by mouth daily. 11/20/18   [provider]  glimepiride (AMARYL) 2 MG tablet Take 2 mg by mouth daily. 03/10/19   [provider]  HYDROcodone-acetaminophen (NORCO/VICODIN) 5-325 MG tablet Take 1 tablet by mouth every 4 (four) hours as needed.  06/25/17   [provider]  lisinopril (PRINIVIL,ZESTRIL) 10 MG tablet Take 10 mg by mouth.    [provider]  meloxicam (MOBIC) 7.5 MG tablet Take 7.5 mg by mouth daily. 02/19/19 02/19/20  [provider]  metFORMIN (GLUCOPHAGE) 500 MG tablet TAKE ONE TABLET BY MOUTH TWICE DAILY BEFORE MEAL(S) 02/12/17   [provider]  omeprazole (PRILOSEC) 20 MG capsule Take 1 capsule (20 mg total) by mouth 2 (two) times daily. 03/25/19   Henreitta Leber, MD  phenazopyridine (PYRIDIUM) 100 MG tablet Take 1 tablet (100 mg total) by mouth 3 (three) times daily with meals. 03/25/19   Henreitta Leber, MD  potassium chloride (K-DUR) 10 MEQ tablet TAKE ONE TABLET BY MOUTH THREE TIMES DAILY 08/23/16   [provider]  pramipexole (MIRAPEX) 0.25 MG tablet Take 0.25 mg by mouth at  bedtime. 12/18/18   [provider]    Allergies Eucalyptus flavor [flavoring agent], Macrolides and ketolides, Sulfa antibiotics, Mirapex [pramipexole], Salmon oil [nutritional supplements], Tramadol hcl, and Zithromax [azithromycin]  Family Hx Family History  Problem Relation Age of Onset   Breast cancer Maternal Aunt 52   Breast cancer Maternal Grandmother 70    Social Hx Social History   Tobacco Use   Smoking status: Current Every Day Smoker     Packs/day: 1.00    Types: Cigarettes   Smokeless tobacco: Never Used  Vaping Use   Vaping Use: Never used  Substance Use Topics   Alcohol use: No   Drug use: No     Review of Systems  Constitutional: Negative for fever. Negative for chills. Eyes: Negative for visual changes. ENT: Negative for sore throat. Cardiovascular: + for chest pain. Respiratory: + DOE Gastrointestinal: + nausea Genitourinary: Negative for dysuria. Musculoskeletal: Negative for leg swelling. Skin: Negative for rash. Neurological: Negative for headaches.   Physical Exam  Vital Signs: ED Triage Vitals  Enc Vitals Group     BP 11/20/19 0436 (!) 145/89     Pulse Rate 11/20/19 0436 93     Resp 11/20/19 0436 18     Temp 11/20/19 0436 98.1 F (36.7 C)     Temp Source 11/20/19 0436 Oral     SpO2 11/20/19 0436 95 %     Weight 11/20/19 0435 245 lb (111.1 kg)     Height 11/20/19 0435 5\' 1"  (1.549 m)     Head Circumference --      Peak Flow --      Pain Score 11/20/19 0435 9     Pain Loc --      Pain Edu? --      Excl. in [place]? --     Constitutional: Alert and oriented. Well appearing. NAD.  Head: Normocephalic. Atraumatic. Eyes: Conjunctivae clear. Sclera anicteric. Pupils equal and symmetric. Nose: No masses or lesions. No congestion or rhinorrhea. Mouth/Throat: Wearing mask.  Neck: No stridor. Trachea midline.  Cardiovascular: Normal rate, regular rhythm. Extremities well perfused. Respiratory: Normal respiratory effort.  Lungs CTAB. Gastrointestinal: Soft. Non-distended. Non-tender.  Genitourinary: Deferred. Musculoskeletal: Trace pretibial edema of the LE bilaterally.  No deformities. Neurologic:  Normal speech and language. No gross focal or lateralizing neurologic deficits are appreciated.  Skin: Skin is warm, dry and intact. No rash noted. Psychiatric: Mood and affect are appropriate for situation.  EKG  Personally reviewed and interpreted by myself.   Date: 11/20/19 Time:  0438 Rate: 88 Rhythm: sinus Axis: normal Intervals: WNL No STEMI    Radiology  Personally reviewed available imaging myself.   CXR - IMPRESSION:  No active cardiopulmonary disease.    Procedures  Procedure(s) performed (including critical care):  Procedures   Initial Impression / Assessment and Plan / MDM / ED Course  54 y.o. female who presents to the ED for chest pain and dyspnea on exertion, concerning for cardiac etiology, especially in the setting of her risk factors which include HTN, DM, tobacco use  Ddx: ACS, exertional angina, anemia, bronchitis, other pulmonary infection.  She does have trace pretibial edema to the lower extremities, but this appears symmetric.  No tachycardia or hypoxia, doubt PE.  Will plan for labs, imaging, EKG, full dose ASA  No acute ischemic changes on EKG.  However, high-sensitivity troponins mildly elevated and slightly increasing (27 > 30 > 47). Currently chest pain free, will hold on heparin for now, received full dose ASA.  Given the description of her pain along with her risk factors, recommend admission for further cardiac work-up/rule out.  Patient is agreeable.  Will admit.   _______________________________   As part of my medical decision making I have reviewed available labs, radiology tests, reviewed old records/performed chart review.    Final Clinical Impression(s) / ED Diagnosis  Chest pain in adult Dyspnea on exertion    Note:  This document was prepared using Dragon voice recognition software and may include unintentional dictation errors.   Lilia Pro., MD 11/20/19 [phone]

## 2019-11-20 NOTE — ED Notes (Signed)
Pt refusing COVID swab at this time.

## 2019-11-21 ENCOUNTER — Ambulatory Visit: Payer: Self-pay | Attending: Physician Assistant

## 2019-11-21 DIAGNOSIS — E119 Type 2 diabetes mellitus without complications: Secondary | ICD-10-CM

## 2019-11-21 DIAGNOSIS — R079 Chest pain, unspecified: Secondary | ICD-10-CM | POA: Insufficient documentation

## 2019-11-21 LAB — NM MYOCAR MULTI W/SPECT W/WALL MOTION / EF
Estimated workload: 1 METS
Exercise duration (min): 1 min
Exercise duration (sec): 0 s
LV dias vol: 88 mL (ref 46–106)
LV sys vol: 35 mL
MPHR: 165 {beats}/min
Peak HR: 107 {beats}/min
Percent HR: 64 %
Rest HR: 78 {beats}/min
SDS: 0
SRS: 8
SSS: 2
TID: 0.85

## 2019-11-21 LAB — GLUCOSE, CAPILLARY
Glucose-Capillary: 163 mg/dL — ABNORMAL HIGH (ref 70–99)
Glucose-Capillary: 250 mg/dL — ABNORMAL HIGH (ref 70–99)

## 2019-11-21 LAB — ECHOCARDIOGRAM COMPLETE
Height: 61 in
Weight: 3920 oz

## 2019-11-21 LAB — LIPID PANEL
Cholesterol: 186 mg/dL (ref 0–200)
HDL: 27 mg/dL — ABNORMAL LOW (ref >40)
LDL Cholesterol: 128 mg/dL — ABNORMAL HIGH (ref 0–99)
Total CHOL/HDL Ratio: 6.9 RATIO
Triglycerides: 156 mg/dL — ABNORMAL HIGH (ref <150)
VLDL: 31 mg/dL (ref 0–40)

## 2019-11-21 MED ORDER — TECHNETIUM TC 99M TETROFOSMIN IV KIT
10.0000 | PACK | Freq: Once | INTRAVENOUS | Status: AC | PRN
  Administered 2019-11-21: 10.75 via INTRAVENOUS

## 2019-11-21 MED ORDER — TECHNETIUM TC 99M TETROFOSMIN IV KIT
30.0000 | PACK | Freq: Once | INTRAVENOUS | Status: AC | PRN
  Administered 2019-11-21: 30.65 via INTRAVENOUS

## 2019-11-21 MED ORDER — FUROSEMIDE 20 MG PO TABS: 20.0000 mg | ORAL_TABLET | Freq: Every day | ORAL | Status: DC

## 2019-11-21 MED ORDER — ATORVASTATIN CALCIUM 40 MG PO TABS: 40.0000 mg | ORAL_TABLET | Freq: Every day | ORAL | 0 refills | Status: DC

## 2019-11-21 MED ORDER — ASPIRIN 81 MG PO TBEC: 81.0000 mg | DELAYED_RELEASE_TABLET | Freq: Every day | ORAL | 0 refills | Status: AC

## 2019-11-21 MED ORDER — LISINOPRIL 20 MG PO TABS: 20.0000 mg | ORAL_TABLET | Freq: Every day | ORAL | Status: DC

## 2019-11-21 MED ORDER — REGADENOSON 0.4 MG/5ML IV SOLN
0.4000 mg | Freq: Once | INTRAVENOUS | Status: AC
  Administered 2019-11-21: 0.4 mg via INTRAVENOUS

## 2019-11-21 MED ORDER — VITAMIN D 25 MCG (1000 UNIT) PO TABS
2000.0000 [IU] | ORAL_TABLET | Freq: Every day | ORAL | Status: DC
  Administered 2019-11-21: 2000 [IU] via ORAL
  Filled 2019-11-21: qty 2

## 2019-11-21 MED ORDER — NICOTINE 21 MG/24HR TD PT24: 21.0000 mg | MEDICATED_PATCH | Freq: Every day | TRANSDERMAL | 0 refills | Status: DC

## 2019-11-21 NOTE — Plan of Care (Signed)
°  Problem: Clinical Measurements: Goal: Respiratory complications will improve Outcome: Progressing   Problem: Education: Goal: Knowledge of General Education information will improve Description: Including pain rating scale, medication(s)/side effects and non-pharmacologic comfort measures Outcome: Progressing   Problem: Health Behavior/Discharge Planning: Goal: Ability to manage health-related needs will improve Outcome: Progressing   Problem: Clinical Measurements: Goal: Ability to maintain clinical measurements within normal limits will improve Outcome: Progressing Goal: Will remain free from infection Outcome: Progressing Goal: Diagnostic test results will improve Outcome: Progressing Goal: Respiratory complications will improve Outcome: Progressing Goal: Cardiovascular complication will be avoided Outcome: Progressing   Problem: Nutrition: Goal: Adequate nutrition will be maintained Outcome: Progressing   Problem: Coping: Goal: Level of anxiety will decrease Outcome: Progressing   Problem: Pain Managment: Goal: General experience of comfort will improve Outcome: Progressing   Problem: Safety: Goal: Ability to remain free from injury will improve Outcome: Progressing

## 2019-11-21 NOTE — Progress Notes (Signed)
[facility] Cardiology    SUBJECTIVE: The patient reports feeling well without recurrent chest pain.   Vitals:   11/20/19 2145 11/20/19 2219 11/21/19 0625 11/21/19 0725  BP:  (!) 144/77 140/69 139/81  Pulse: 75 79 89 91  Resp: 15 18 17 19   Temp:  98.1 F (36.7 C) 98.2 F (36.8 C) 98 F (36.7 C)  TempSrc:  Oral    SpO2: 97% 98% 98% 96%  Weight:   112.7 kg   Height:         Intake/Output Summary (Last 24 hours) at 11/21/2019 1011 Last data filed at 11/21/2019 0600 Gross per 24 hour  Intake 480 ml  Output --  Net 480 ml      PHYSICAL EXAM  General: Well developed, well nourished, in no acute distress, lying in bed HEENT:  Normocephalic and atramatic Neck:  No JVD.  Lungs: Clear bilaterally to auscultation, normal effort of breathing on room air Heart: HRRR . Normal S1 and S2 without gallops or murmurs.  Abdomen: nondistended Msk:  Back normal, gait not assessed. Normal strength and tone for age. Extremities: Trace bilateral lower extremity edema.   Neuro: Alert and oriented X 3. Psych:  Good affect, responds appropriately   LABS: Basic Metabolic Panel: Recent Labs    11/20/19 0438  NA 138  K 4.1  CL 99  CO2 29  GLUCOSE 271*  BUN 12  CREATININE 0.85  CALCIUM 9.2   Liver Function Tests: Recent Labs    11/20/19 0438  AST 38  ALT 38  ALKPHOS 59  BILITOT 0.5  PROT 7.3  ALBUMIN 3.7   No results for input(s): LIPASE, AMYLASE in the last 72 hours. CBC: Recent Labs    11/20/19 0438  WBC 7.8  NEUTROABS 3.7  HGB 13.0  HCT 40.5  MCV 86.5  PLT 178   Cardiac Enzymes: No results for input(s): CKTOTAL, CKMB, CKMBINDEX, TROPONINI in the last 72 hours. BNP: Invalid input(s): POCBNP D-Dimer: No results for input(s): DDIMER in the last 72 hours. Hemoglobin A1C: No results for input(s): HGBA1C in the last 72 hours. Fasting Lipid Panel: Recent Labs    11/21/19 0455  CHOL 186  HDL 27*  LDLCALC 128*  TRIG 156*  CHOLHDL 6.9   Thyroid Function Tests: No  results for input(s): TSH, T4TOTAL, T3FREE, THYROIDAB in the last 72 hours.  Invalid input(s): FREET3 Anemia Panel: No results for input(s): VITAMINB12, FOLATE, FERRITIN, TIBC, IRON, RETICCTPCT in the last 72 hours.  DG Chest 2 View  Result Date: 11/20/2019 CLINICAL DATA:  Chest pain EXAM: CHEST - 2 VIEW COMPARISON:  April 21, 2017 FINDINGS: The heart size and mediastinal contours are within normal limits. Both lungs are clear. The visualized skeletal structures are unremarkable. IMPRESSION: No active cardiopulmonary disease. Electronically Signed   By: Prudencio Pair M.D.   On: 11/20/2019 05:11     Echo pending  TELEMETRY: NSR  ASSESSMENT AND PLAN:  Principal Problem:   Chest pain Active Problems:   Elevated troponin   Hypertension   GERD (gastroesophageal reflux disease)   Tobacco abuse   Diabetes mellitus without complication ([place])    1. Chest pain, with typical and atypical features, with borderline elevated high-sensitivity troponin (27, 30, 47, 48, 59) with normal initial ECG, with no significant changes with repeat ECG, with multiple cardiovascular risk factors. Currently chest pain free. Lexiscan Myoview and echocardiogram this morning. 2. Type II diabetes 3. Hyperlipidemia, on atorvastatin 4. Hypertension, on lisinopril 5. Tobacco abuse  Recommendations: 1. Continue  home medications 2. Review Lexiscan Myoview and 2D echocardiogram. If both are without acute abnormalities, and patient remains chest pain free, she may be discharged later today from cardiovascular perspective. 3. Strongly encourage smoking cessation  Clabe Seal, PA-C 11/21/2019 10:11 AM    Discussed with Dr. Saralyn Pilar who agrees with the above plan.

## 2019-11-21 NOTE — Discharge Summary (Signed)
Physician Discharge Summary  Patient ID: Martha Hill MRN: [id number] DOB/AGE: 11/25/1965 54 y.o.  Admit date: 11/20/2019 Discharge date: 11/21/2019  Admission Diagnoses:  Discharge Diagnoses:  Principal Problem:   Chest pain Active Problems:   Elevated troponin   Hypertension   GERD (gastroesophageal reflux disease)   Tobacco abuse   Diabetes mellitus without complication [facility])   Discharged Condition: good  Hospital Course:  HPI: Martha Hill is a 54 y.o. female with medical history significant of hypertension, diabetes mellitus, GERD, gout, OSA, renal cancer, tobacco abuse, who presents with chest pain.  Her chest pain started in the early morning, which is located in central chest, moderate, pressure, radiating to the left arm and jaw.  She has mild dry cough and shortness of breath, which is worse on exertion.  Denies fever or chills.  She has nausea, but no vomiting, diarrhea, abdominal pain, symptoms of UTI or unilateral weakness.   ED Course: pt was found to have troponin 22 --> 30 -->47, WBC 7.8, pending COVID-19 PCR, electrolytes renal function okay, temperature normal, blood pressure 119/66, heart rate 80s, RR 18, oxygen saturation 95% on room air.  Chest x-ray negative.  Patient is placed on progressive bed for observation.  Cardiology, Dr. Saralyn Pilar is consulted.  Patient is seen by cardiology, nuclear stress test was performed.  I was notified by cardiology, the result came back negative for ischemia.  She is medically stable to be discharged.    Consults: cardiology  Significant Diagnostic Studies:   Blood pressure demonstrated a normal response to exercise.  There was no ST segment deviation noted during stress.  The study is normal.  This is a low risk study.  The left ventricular ejection fraction is hyperdynamic (>65%).  Treatments: Stress test and cardiac monitoring.  Discharge Exam: Blood pressure 122/68, pulse 80, temperature 98.3 F (36.8  C), temperature source Oral, resp. rate 18, height 5\' 1"  (1.549 m), weight 112.7 kg, SpO2 94 %. General appearance: alert, cooperative and Morbidly obese Resp: clear to auscultation bilaterally Cardio: regular rate and rhythm, S1, S2 normal, no murmur, click, rub or gallop GI: soft, non-tender; bowel sounds normal; no masses,  no organomegaly Extremities: extremities normal, atraumatic, no cyanosis or edema  Disposition: Discharge disposition: 01-Home or Self Care       Discharge Instructions    Diet - low sodium heart healthy   Complete by: As directed    Increase activity slowly   Complete by: As directed      Allergies as of 11/21/2019      Reactions   Eucalyptus Flavor [flavoring Agent] Shortness Of Breath   Macrolides And Ketolides Nausea And Vomiting   Azithromycin/erythromycin   Salmon Oil [nutritional Supplements] Swelling   Sulfa Antibiotics Other (See Comments)   Contact dermatitis due to intravaginal preparation   Tramadol Hcl    Zithromax [azithromycin]       Medication List    STOP taking these medications   meloxicam 7.5 MG tablet Commonly known as: MOBIC     TAKE these medications   albuterol 108 (90 Base) MCG/ACT inhaler Commonly known as: VENTOLIN HFA Inhale into the lungs every 6 (six) hours as needed for wheezing or shortness of breath.   aspirin 81 MG EC tablet Take 1 tablet (81 mg total) by mouth daily for 14 days. Swallow whole. Start taking on: November 22, 2019   atorvastatin 40 MG tablet Commonly known as: LIPITOR Take 1 tablet (40 mg total) by mouth daily. Start taking  on: November 22, 2019   Cholecalciferol 50 MCG (2000 UT) Caps Take 2,000 Units by mouth daily at 6 (six) AM.   cyanocobalamin 2000 MCG tablet Take 1 tablet (2,000 mcg total) by mouth daily.   furosemide 20 MG tablet Commonly known as: LASIX Take 20 mg by mouth daily.   glimepiride 2 MG tablet Commonly known as: AMARYL Take 2 mg by mouth daily.   lisinopril 20 MG  tablet Commonly known as: ZESTRIL Take 20 mg by mouth daily.   Magnesium 250 MG Tabs Take 250 mg by mouth daily at 6 (six) AM.   metFORMIN 500 MG tablet Commonly known as: GLUCOPHAGE TAKE ONE TABLET BY MOUTH TWICE DAILY BEFORE MEAL(S)   nicotine 21 mg/24hr patch Commonly known as: NICODERM CQ - dosed in mg/24 hours Place 1 patch (21 mg total) onto the skin daily. Start taking on: November 22, 2019   omeprazole 20 MG capsule Commonly known as: PRILOSEC Take 1 capsule (20 mg total) by mouth 2 (two) times daily.   phenazopyridine 100 MG tablet Commonly known as: PYRIDIUM Take 1 tablet (100 mg total) by mouth 3 (three) times daily with meals.   potassium chloride 10 MEQ tablet Commonly known as: KLOR-CON TAKE ONE TABLET BY MOUTH THREE TIMES DAILY   pramipexole 0.25 MG tablet Commonly known as: MIRAPEX Take 0.25 mg by mouth at bedtime.       Follow-up Information    Paraschos, Alexander, MD. Go in 1 week(s).   Specialty: Cardiology Contact information: [facility] [place] [address] [phone]        Rusty Aus, MD Follow up in 1 week(s).   Specialty: Internal Medicine Contact information: [place] [address] [phone]               Signed: Sharen Hones 11/21/2019, 1:10 PM

## 2019-11-21 NOTE — Discharge Instructions (Signed)
1.  Follow-up with PCP in 1 week. 2.  Follow-up with cardiology in 1 week.

## 2019-11-21 NOTE — Plan of Care (Signed)

## 2019-11-21 NOTE — Progress Notes (Signed)
SATURATION QUALIFICATIONS: (This note is used to comply with regulatory documentation for home oxygen)  Patient Saturations on Room Air at Rest = 96%  Patient Saturations on Room Air while Ambulating = 97%    Please briefly explain why patient needs home oxygen:

## 2020-07-29 ENCOUNTER — Inpatient Hospital Stay: Payer: Medicaid Other

## 2020-07-29 ENCOUNTER — Inpatient Hospital Stay: Payer: Medicaid Other | Admitting: Licensed Clinical Social Worker

## 2020-08-05 ENCOUNTER — Inpatient Hospital Stay: Payer: Medicaid Other | Attending: Oncology | Admitting: Licensed Clinical Social Worker

## 2020-08-05 ENCOUNTER — Other Ambulatory Visit: Payer: Self-pay

## 2020-08-05 ENCOUNTER — Inpatient Hospital Stay: Payer: Medicaid Other

## 2020-08-05 ENCOUNTER — Encounter: Payer: Self-pay | Admitting: Licensed Clinical Social Worker

## 2020-08-05 DIAGNOSIS — Q859 Phakomatosis, unspecified: Secondary | ICD-10-CM

## 2020-08-05 DIAGNOSIS — Z85528 Personal history of other malignant neoplasm of kidney: Secondary | ICD-10-CM | POA: Diagnosis not present

## 2020-08-05 DIAGNOSIS — Z860100 Personal history of colon polyps, unspecified: Secondary | ICD-10-CM | POA: Insufficient documentation

## 2020-08-05 DIAGNOSIS — Z803 Family history of malignant neoplasm of breast: Secondary | ICD-10-CM | POA: Insufficient documentation

## 2020-08-05 DIAGNOSIS — Z8371 Family history of colonic polyps: Secondary | ICD-10-CM | POA: Diagnosis not present

## 2020-08-05 DIAGNOSIS — Z800 Family history of malignant neoplasm of digestive organs: Secondary | ICD-10-CM

## 2020-08-05 DIAGNOSIS — Z801 Family history of malignant neoplasm of trachea, bronchus and lung: Secondary | ICD-10-CM | POA: Insufficient documentation

## 2020-08-05 NOTE — Progress Notes (Signed)
REFERRING PROVIDER: Ok Edwards, NP [address],  [place] [address]  PRIMARY PROVIDER:  Rusty Aus, MD  PRIMARY REASON FOR VISIT:  1. Family history of breast cancer   2. Family history of lung cancer   3. Family history of colon cancer   4. Family history of colonic polyps   5. Personal history of colonic polyps   6. Hamartomatous polyp of large intestine (HCC)   7. History of kidney cancer      HISTORY OF PRESENT ILLNESS:   Ms. Martha Hill, a 55 y.o. female, was seen for a [place] cancer genetics consultation at the request of Dr. Jacqulyn Liner due to a personal and family history of cancer and polyps.  Ms. Bays presents to clinic today to discuss the possibility of a hereditary predisposition to cancer, genetic testing, and to further clarify her future cancer risks, as well as potential cancer risks for family members.   At the age of 77, Ms. Lambe was diagnosed with kidney cancer. She has had 2 colonoscopies. On her 2019 colonoscopy, 10 colon polyps were found, a mix of hyperplastic polyps and 1 hamartomatous polyp.   CANCER HISTORY:  Oncology History   No history exists.     RISK FACTORS:  Menarche was at age 18.  First live birth at age 44.  OCP use for approximately 20 years.  Ovaries intact: yes.  Hysterectomy: yes.  Menopausal status: postmenopausal.  HRT use: 0 years. Colonoscopy: yes; see above. Mammogram within the last year: no. Number of breast biopsies: 0. Up to date with pelvic exams: no. Any excessive radiation exposure in the past: no  Past Medical History:  Diagnosis Date  . Arthritis   . Diabetes mellitus without complication ([place])   . Family history of breast cancer   . Family history of colon cancer   . Family history of colonic polyps   . Family history of lung cancer   . GERD (gastroesophageal reflux disease)   . Gout   . Hamartomatous polyp of large intestine ([place])   . History of kidney  cancer   . Hypertension   . Personal history of colonic polyps   . Renal cancer ([place]) 2008  . Sleep apnea     Past Surgical History:  Procedure Laterality Date  . ABDOMINAL HYSTERECTOMY    . CHOLECYSTECTOMY    . COLONOSCOPY WITH PROPOFOL N/A 01/15/2018   Procedure: COLONOSCOPY WITH PROPOFOL;  Surgeon: Lollie Sails, MD;  Location: [facility] ENDOSCOPY;  Service: Endoscopy;  Laterality: N/A;  . COLONOSCOPY WITH PROPOFOL N/A 02/11/2018   Procedure: COLONOSCOPY WITH PROPOFOL;  Surgeon: Lollie Sails, MD;  Location: [facility] ENDOSCOPY;  Service: Endoscopy;  Laterality: N/A;  . ROBOTIC ASSITED PARTIAL NEPHRECTOMY Left 2008  . TONSILLECTOMY      Social History   Socioeconomic History  . Marital status: Legally Separated    Spouse name: Not on file  . Number of children: Not on file  . Years of education: Not on file  . Highest education level: Not on file  Occupational History  . Not on file  Tobacco Use  . Smoking status: Current Every Day Smoker    Packs/day: 1.00    Types: Cigarettes  . Smokeless tobacco: Never Used  Vaping Use  . Vaping Use: Never used  Substance and Sexual Activity  . Alcohol use: No  . Drug use: No  . Sexual activity: Not on file  Other Topics Concern  . Not  on file  Social History Narrative  . Not on file   Social Determinants of Health   Financial Resource Strain: Not on file  Food Insecurity: Not on file  Transportation Needs: Not on file  Physical Activity: Not on file  Stress: Not on file  Social Connections: Not on file     FAMILY HISTORY:  We obtained a detailed, 4-generation family history.  Significant diagnoses are listed below: Family History  Problem Relation Age of Onset  . Breast cancer Maternal Aunt        dx late 55s  . Breast cancer Maternal Grandmother 68  . Lung cancer Paternal Uncle   . Colon cancer Brother   . Colon polyps Brother        66+   Ms. Winnett's brother has had over 200 colon polyps, some were cancerous  recently, reportedly no genetic testing. Ms. Desrosier maternal aunt had breast cancer in her 72s and died in her 9s, maternal grandmother also had breast cancer in her 71s and died in her 67s. Paternal uncle had lung cancer. No other known cancers in the family.  Ms. Crosby is Francene Boyers of previous family history of genetic testing for hereditary cancer risks. Patient's maternal ancestors are of unknown descent, and paternal ancestors are of unknown descent. There is no reported Ashkenazi Jewish ancestry. There is no known consanguinity.  GENETIC COUNSELING ASSESSMENT: Ms. Neuhaus is a 54 y.o. female with a personal and family history of cancer and colon polyps which is somewhat suggestive of a hereditary cancer syndrome and predisposition to cancer. We, therefore, discussed and recommended the following at today's visit.   DISCUSSION: We discussed that approximately 5-10% of breast cancer is hereditary  Most cases of hereditary breast cancer are associated with BRCA1/BRCA2 genes, although there are other genes associated with hereditary cancer as well including genes associated with kidney cancer.  We discussed that polyps in general are common, however, most people have fewer than 5 lifetime polyps.  When an individual has 10 or more polyps we become concerned about an underlying polyposis syndrome.  The most common hereditary polyposis syndromes are caused by problems in the APC and MUTYH genes, however, more recently, mutations in the Mize and MSH3 genes have been identified in some polyposis families.  \ Hamartomatous polyps may be sporadic or reveal a hereditary syndrome. The hamartomatous polyposis syndromes include juvenile polyposis syndrome (JPS); PTEN hamartoma tumor syndrome, and Peutz-Jeghers syndrome (PJS). JPS is a hereditary condition that is characterized by the presence of hamartomatous polyps in the digestive tract caused by BMPR1A and SMAD4 genes. PTEN hamartoma tumor syndrome is  characterized by a high risk of both benign and cancerous tumors of the breast, thyroid, endometrium (uterus), colorectal, kidney, and skin (melanoma). Other key features are skin changes, such as trichilemmomas (skin tags) and papillomatous papules, and macrocephaly, meaning larger than average head size. PJS is an inherited condition that puts people at an increased risk for developing hamartomatous polyps in the digestive tract, as well as cancers of the breast, colon and rectum, pancreas, stomach, testicles, ovaries, lung, cervix, and other types, caused by STK11 gene. We discussed that testing is beneficial for several reasons including  knowing about other cancer risks, identifying potential screening and risk-reduction options that may be appropriate, and to understand if other family members could be at risk for cancer and allow them to undergo genetic testing.   We reviewed the characteristics, features and inheritance patterns of hereditary cancer syndromes. We also discussed genetic  testing, including the appropriate family members to test, the process of testing, insurance coverage and turn-around-time for results. We discussed the implications of a negative, positive and/or variant of uncertain significant result. We recommended Ms. Housh pursue genetic testing for the [facility] Multi-Cancer gene panel.   The Multi-Cancer Panel offered by [facility] includes sequencing and/or deletion duplication testing of the following 84 genes: AIP, ALK, APC, ATM, AXIN2,BAP1,  BARD1, BLM, BMPR1A, BRCA1, BRCA2, BRIP1, CASR, CDC73, CDH1, CDK4, CDKN1B, CDKN1C, CDKN2A (p14ARF), CDKN2A (p16INK4a), CEBPA, CHEK2, CTNNA1, DICER1, DIS3L2, EGFR (c.2369C>T, p.Thr790Met variant only), EPCAM (Deletion/duplication testing only), FH, FLCN, GATA2, GPC3, GREM1 (Promoter region deletion/duplication testing only), HOXB13 (c.251G>A, p.Gly84Glu), HRAS, KIT, MAX, MEN1, MET, MITF (c.952G>A, p.Glu318Lys variant only), MLH1, MSH2, MSH3, MSH6,  MUTYH, NBN, NF1, NF2, NTHL1, PALB2, PDGFRA, PHOX2B, PMS2, POLD1, POLE, POT1, PRKAR1A, PTCH1, PTEN, RAD50, RAD51C, RAD51D, RB1, RECQL4, RET,  RUNX1, SDHAF2, SDHA (sequence changes only), SDHB, SDHC, SDHD, SMAD4, SMARCA4, SMARCB1, SMARCE1, STK11, SUFU, TERC, TERT, TMEM127, TP53, TSC1, TSC2, VHL, WRN and WT1.   Based on Ms. Valera's personal and family history of cancer, she meets medical criteria for genetic testing. Despite that she meets criteria, she may still have an out of pocket cost. We discussed that if her out of pocket cost for testing is over $100, the laboratory will call and confirm whether she wants to proceed with testing.  If the out of pocket cost of testing is less than $100 she will be billed by the genetic testing laboratory.   PLAN: After considering the risks, benefits, and limitations, Ms. Tredway provided informed consent to pursue genetic testing and the blood sample was sent to [facility] for analysis of the Multi-Cancer Panel. Results should be available within approximately 2-3 weeks' time, at which point they will be disclosed by telephone to Ms. Vassey, as will any additional recommendations warranted by these results. Ms. Mcmartin will receive a summary of her genetic counseling visit and a copy of her results once available. This information will also be available in Epic.   Based on Ms. Kaufhold's family history, we recommended her brother have genetic counseling and testing. Ms. Summey will let us know if we can be of any assistance in coordinating genetic counseling and/or testing for this family member.   Ms. Eckart questions were answered to her satisfaction today. Our contact information was provided should additional questions or concerns arise. Thank you for the referral and allowing [place] to share in the care of your patient.   Faith Rogue, MS, [facility] Genetic Counselor [email or web address] Phone: [phone]  The patient was seen for a total of 40 minutes  in face-to-face genetic counseling.  Dr. Grayland Ormond was available for discussion regarding this case.   _______________________________________________________________________ For Office Staff:  Number of people involved in session: 1 Was an Intern/ student involved with case: no

## 2020-08-19 ENCOUNTER — Encounter: Payer: Self-pay | Admitting: Licensed Clinical Social Worker

## 2020-08-19 ENCOUNTER — Telehealth: Payer: Self-pay | Admitting: Licensed Clinical Social Worker

## 2020-08-19 ENCOUNTER — Ambulatory Visit: Payer: Self-pay | Admitting: Licensed Clinical Social Worker

## 2020-08-19 DIAGNOSIS — Z860100 Personal history of colon polyps, unspecified: Secondary | ICD-10-CM

## 2020-08-19 DIAGNOSIS — Z8371 Family history of colonic polyps: Secondary | ICD-10-CM

## 2020-08-19 DIAGNOSIS — Z1379 Encounter for other screening for genetic and chromosomal anomalies: Secondary | ICD-10-CM | POA: Insufficient documentation

## 2020-08-19 DIAGNOSIS — Z803 Family history of malignant neoplasm of breast: Secondary | ICD-10-CM

## 2020-08-19 DIAGNOSIS — Z83719 Family history of colon polyps, unspecified: Secondary | ICD-10-CM

## 2020-08-19 DIAGNOSIS — Z85528 Personal history of other malignant neoplasm of kidney: Secondary | ICD-10-CM

## 2020-08-19 DIAGNOSIS — Z800 Family history of malignant neoplasm of digestive organs: Secondary | ICD-10-CM

## 2020-08-19 DIAGNOSIS — Q859 Phakomatosis, unspecified: Secondary | ICD-10-CM

## 2020-08-19 DIAGNOSIS — Z801 Family history of malignant neoplasm of trachea, bronchus and lung: Secondary | ICD-10-CM

## 2020-08-19 NOTE — Telephone Encounter (Signed)
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  It is unlikely that there is an increased risk of another cancer due to a mutation in one of these genes.  However, genetic testing is not perfect, and cannot definitively rule out a hereditary cause.  It will be important for her to keep in contact with genetics to learn if any additional testing may be needed in the future.

## 2020-08-19 NOTE — Progress Notes (Signed)
HPI:  Ms. Zamarripa was previously seen in the [facility] due to a personal and family history of cancer and colon polyps and concerns regarding a hereditary predisposition to cancer. Please refer to our prior cancer genetics clinic note for more information regarding our discussion, assessment and recommendations, at the time. Ms. Purdie recent genetic test results were disclosed to her, as were recommendations warranted by these results. These results and recommendations are discussed in more detail below.  CANCER HISTORY:  Oncology History   No history exists.    FAMILY HISTORY:  We obtained a detailed, 4-generation family history.  Significant diagnoses are listed below: Family History  Problem Relation Age of Onset  . Breast cancer Maternal Aunt        dx late 37s  . Breast cancer Maternal Grandmother 66  . Lung cancer Paternal Uncle   . Colon cancer Brother   . Colon polyps Brother        37+    Ms. Mccole's brother has had over 200 colon polyps, some were cancerous recently, reportedly no genetic testing. Ms. Alcivar maternal aunt had breast cancer in her 17s and died in her 25s, maternal grandmother also had breast cancer in her 23s and died in her 53s. Paternal uncle had lung cancer. No other known cancers in the family.  Ms. Seith is Francene Boyers of previous family history of genetic testing for hereditary cancer risks. Patient's maternal ancestors are of unknown descent, and paternal ancestors are of unknown descent. There is no reported Ashkenazi Jewish ancestry. There is no known consanguinity.  GENETIC TEST RESULTS: Genetic testing reported out on 08/17/2020 through the [facility] Multi- cancer panel found an increased risk allele in HOXB13 called c.251G>A. Testing was otherwise negative/normal.   The Multi-Cancer Panel + RNA offered by [facility] includes sequencing and/or deletion duplication testing of the following 84 genes: AIP, ALK, APC, ATM, AXIN2,BAP1,  BARD1,  BLM, BMPR1A, BRCA1, BRCA2, BRIP1, CASR, CDC73, CDH1, CDK4, CDKN1B, CDKN1C, CDKN2A (p14ARF), CDKN2A (p16INK4a), CEBPA, CHEK2, CTNNA1, DICER1, DIS3L2, EGFR (c.2369C>T, p.Thr790Met variant only), EPCAM (Deletion/duplication testing only), FH, FLCN, GATA2, GPC3, GREM1 (Promoter region deletion/duplication testing only), HOXB13 (c.251G>A, p.Gly84Glu), HRAS, KIT, MAX, MEN1, MET, MITF (c.952G>A, p.Glu318Lys variant only), MLH1, MSH2, MSH3, MSH6, MUTYH, NBN, NF1, NF2, NTHL1, PALB2, PDGFRA, PHOX2B, PMS2, POLD1, POLE, POT1, PRKAR1A, PTCH1, PTEN, RAD50, RAD51C, RAD51D, RB1, RECQL4, RET, RUNX1, SDHAF2, SDHA (sequence changes only), SDHB, SDHC, SDHD, SMAD4, SMARCA4, SMARCB1, SMARCE1, STK11, SUFU, TERC, TERT, TMEM127, TP53, TSC1, TSC2, VHL, WRN and WT1.  The test report has been scanned into EPIC and is located under the Molecular Pathology section of the Results Review tab.  A portion of the result report is included below for reference.     We discussed with Ms. Zavada that because current genetic testing is not perfect, it is possible there may be a gene mutation in one of these genes that current testing cannot detect, but that chance is small.  We also discussed, that there could be another gene that has not yet been discovered, or that we have not yet tested, that is responsible for the cancer diagnoses in the family. It is also possible there is a hereditary cause for the cancer in the family that Ms. Boudreau did not inherit and therefore was not identified in her testing.  Therefore, it is important to remain in touch with cancer genetics in the future so that we can continue to offer Ms. Nordquist the most up to date genetic testing.  HOXB13  HOXB13 is associated with an increased prostate cancer risk. Those with a HOXB13 risk allele have a  3-4.5 times more likely to get prostate cancer than the average individual. While this does not impact medical management for Ms. Finchum, it will be important for relatives,  especially males, to be aware of this and to have testing. Men with HOXB13 risk allele can consider prostate cancer screening starting age 11.   ADDITIONAL GENETIC TESTING: We discussed with Ms. Stradley that her genetic testing was fairly extensive.  If there are genes identified to increase cancer risk that can be analyzed in the future, we would be happy to discuss and coordinate this testing at that time.    CANCER SCREENING RECOMMENDATIONS: Ms. Byus test result is considered negative (normal).  This means that we have not identified a hereditary cause for her  personal and family history of cancer at this time. Most cancers happen by chance and this negative test suggests that her cancer may fall into this category.    While reassuring, this does not definitively rule out a hereditary predisposition to cancer. It is still possible that there could be genetic mutations that are undetectable by current technology. There could be genetic mutations in genes that have not been tested or identified to increase cancer risk.  Therefore, it is recommended she continue to follow the cancer management and screening guidelines provided by her primary healthcare provider.   An individual's cancer risk and medical management are not determined by genetic test results alone. Overall cancer risk assessment incorporates additional factors, including personal medical history, family history, and any available genetic information that may result in a personalized plan for cancer prevention and surveillance.  This negative genetic test simply tells [place] that we cannot yet define why Ms. Pylant has had an increased number of colorectal polyps.  Ms. [facility] medical management and screening should be based on the prospect that she  will likely form more colon polyps and should, therefore, undergo more frequent colonoscopy screening at intervals determined by her GI providers.  We also recommended that Ms. Ayo have an upper  endoscopy periodically.  RECOMMENDATIONS FOR FAMILY MEMBERS:  Relatives in this family might be at some increased risk of developing cancer, over the general population risk, simply due to the family history of cancer.  We recommended female relatives in this family have a yearly mammogram beginning at age 55, or 71 years younger than the earliest onset of cancer, an annual clinical breast exam, and perform monthly breast self-exams. Female relatives in this family should also have a gynecological exam as recommended by their primary provider.  All family members should be referred for colonoscopy starting at age 54.    It is also possible there is a hereditary cause for the cancer in Ms. Scogin's family that she did not inherit and therefore was not identified in her.  Based on Ms. Spinola's family history, we recommended her brother (with 83+ colon polyps) and maternal relatives have genetic counseling and testing. Ms. Briere will let us know if we can be of any assistance in coordinating genetic counseling and/or testing for these family members.  Additionally, family members should be aware of this HOXB13 risk allele so they can undergo testing if they would like to. Her brothers and son have a 50% chance to also have this HOXB13 risk allele. Ms. Licht plans to tell her son and brothers about this result and knows she can contact us for appointments for  them if they would like testing.   FOLLOW-UP: Lastly, we discussed with Ms. Corzine that cancer genetics is a rapidly advancing field and it is possible that new genetic tests will be appropriate for her and/or her family members in the future. We encouraged her to remain in contact with cancer genetics on an annual basis so we can update her personal and family histories and let her know of advances in cancer genetics that may benefit this family.   Our contact number was provided. Ms. Bula questions were answered to her satisfaction, and she knows she  is welcome to call us at anytime with additional questions or concerns.   Faith Rogue, MS, [facility] Genetic Counselor [email or web address] Phone: [phone]

## 2020-09-08 ENCOUNTER — Other Ambulatory Visit: Payer: Self-pay

## 2020-09-08 ENCOUNTER — Other Ambulatory Visit
Admission: RE | Admit: 2020-09-08 | Discharge: 2020-09-08 | Disposition: A | Discharge status: Home / Self Care | Payer: Medicaid Other | Source: Ambulatory Visit | Attending: Gastroenterology | Admitting: Gastroenterology

## 2020-09-08 DIAGNOSIS — Z20822 Contact with and (suspected) exposure to covid-19: Secondary | ICD-10-CM | POA: Insufficient documentation

## 2020-09-08 DIAGNOSIS — Z01812 Encounter for preprocedural laboratory examination: Secondary | ICD-10-CM | POA: Diagnosis not present

## 2020-09-09 ENCOUNTER — Encounter: Payer: Self-pay | Admitting: *Deleted

## 2020-09-09 LAB — SARS CORONAVIRUS 2 (TAT 6-24 HRS): SARS Coronavirus 2: NEGATIVE

## 2020-09-10 ENCOUNTER — Ambulatory Visit
Admission: RE | Admit: 2020-09-10 | Discharge: 2020-09-10 | Disposition: A | Discharge status: Home / Self Care | Payer: Medicaid Other | Attending: Gastroenterology | Admitting: Gastroenterology

## 2020-09-10 ENCOUNTER — Ambulatory Visit: Payer: Medicaid Other | Admitting: Anesthesiology

## 2020-09-10 ENCOUNTER — Encounter
Admission: RE | Disposition: A | Discharge status: Home / Self Care | Payer: Self-pay | Source: Home / Self Care | Attending: Gastroenterology

## 2020-09-10 ENCOUNTER — Encounter: Payer: Self-pay | Admitting: *Deleted

## 2020-09-10 ENCOUNTER — Other Ambulatory Visit: Payer: Self-pay

## 2020-09-10 DIAGNOSIS — D123 Benign neoplasm of transverse colon: Secondary | ICD-10-CM | POA: Insufficient documentation

## 2020-09-10 DIAGNOSIS — Z8371 Family history of colonic polyps: Secondary | ICD-10-CM | POA: Insufficient documentation

## 2020-09-10 DIAGNOSIS — Z79899 Other long term (current) drug therapy: Secondary | ICD-10-CM | POA: Diagnosis not present

## 2020-09-10 DIAGNOSIS — Z09 Encounter for follow-up examination after completed treatment for conditions other than malignant neoplasm: Secondary | ICD-10-CM | POA: Diagnosis present

## 2020-09-10 DIAGNOSIS — Z7951 Long term (current) use of inhaled steroids: Secondary | ICD-10-CM | POA: Insufficient documentation

## 2020-09-10 DIAGNOSIS — Z7984 Long term (current) use of oral hypoglycemic drugs: Secondary | ICD-10-CM | POA: Insufficient documentation

## 2020-09-10 DIAGNOSIS — D125 Benign neoplasm of sigmoid colon: Secondary | ICD-10-CM | POA: Diagnosis not present

## 2020-09-10 DIAGNOSIS — K640 First degree hemorrhoids: Secondary | ICD-10-CM | POA: Insufficient documentation

## 2020-09-10 DIAGNOSIS — Z85528 Personal history of other malignant neoplasm of kidney: Secondary | ICD-10-CM | POA: Diagnosis not present

## 2020-09-10 DIAGNOSIS — K635 Polyp of colon: Secondary | ICD-10-CM | POA: Insufficient documentation

## 2020-09-10 HISTORY — PX: COLONOSCOPY WITH PROPOFOL: SHX5780

## 2020-09-10 HISTORY — DX: Restless legs syndrome: G25.81

## 2020-09-10 HISTORY — DX: Other specified postprocedural states: Z98.890

## 2020-09-10 HISTORY — DX: Family history of other specified conditions: Z84.89

## 2020-09-10 HISTORY — DX: Other intervertebral disc degeneration, lumbar region without mention of lumbar back pain or lower extremity pain: M51.369

## 2020-09-10 HISTORY — DX: Other specified postprocedural states: R11.2

## 2020-09-10 HISTORY — DX: Other intervertebral disc degeneration, lumbar region: M51.36

## 2020-09-10 HISTORY — DX: Hyperlipidemia, unspecified: E78.5

## 2020-09-10 LAB — GLUCOSE, CAPILLARY: Glucose-Capillary: 86 mg/dL (ref 70–99)

## 2020-09-10 SURGERY — COLONOSCOPY WITH PROPOFOL
Anesthesia: General

## 2020-09-10 MED ORDER — FENTANYL CITRATE (PF) 100 MCG/2ML IJ SOLN
INTRAMUSCULAR | Status: DC | PRN
  Administered 2020-09-10 (×2): 50 ug via INTRAVENOUS

## 2020-09-10 MED ORDER — SODIUM CHLORIDE 0.9 % IV SOLN: INTRAVENOUS | Status: DC

## 2020-09-10 MED ORDER — PROPOFOL 10 MG/ML IV BOLUS
INTRAVENOUS | Status: DC | PRN
  Administered 2020-09-10: 30 mg via INTRAVENOUS
  Administered 2020-09-10: 20 mg via INTRAVENOUS
  Administered 2020-09-10 (×2): 10 mg via INTRAVENOUS

## 2020-09-10 MED ORDER — MIDAZOLAM HCL 2 MG/2ML IJ SOLN
INTRAMUSCULAR | Status: DC | PRN
  Administered 2020-09-10: 2 mg via INTRAVENOUS

## 2020-09-10 MED ORDER — FENTANYL CITRATE (PF) 100 MCG/2ML IJ SOLN
INTRAMUSCULAR | Status: AC
  Filled 2020-09-10: qty 2

## 2020-09-10 MED ORDER — LIDOCAINE HCL (CARDIAC) PF 100 MG/5ML IV SOSY
PREFILLED_SYRINGE | INTRAVENOUS | Status: DC | PRN
  Administered 2020-09-10: 50 mg via INTRAVENOUS

## 2020-09-10 MED ORDER — PROPOFOL 500 MG/50ML IV EMUL
INTRAVENOUS | Status: DC | PRN
  Administered 2020-09-10: 50 ug/kg/min via INTRAVENOUS

## 2020-09-10 MED ORDER — MIDAZOLAM HCL 2 MG/2ML IJ SOLN
INTRAMUSCULAR | Status: AC
  Filled 2020-09-10: qty 2

## 2020-09-10 NOTE — Op Note (Signed)
[facility] Patient Name: Martha Hill Procedure Date: 09/10/2020 10:29 AM MRN: [id number] Account #: [id number] Date of Birth: 12-05-1965 Admit Type: Outpatient Age: 55 Room: [facility] ENDO ROOM 3 Gender: Female Note Status: Finalized Procedure:             Colonoscopy Indications:           High risk colon cancer surveillance: Personal history                         of colonic polyps Providers:             Andrey Farmer MD, MD Referring MD:          Rusty Aus, MD (Referring MD) Medicines:             Monitored Anesthesia Care Complications:         No immediate complications. Estimated blood loss:                         Minimal. Procedure:             Pre-Anesthesia Assessment:                        - Prior to the procedure, a History and Physical was                         performed, and patient medications and allergies were                         reviewed. The patient is competent. The risks and                         benefits of the procedure and the sedation options and                         risks were discussed with the patient. All questions                         were answered and informed consent was obtained.                         Patient identification and proposed procedure were                         verified by the physician, the nurse, the anesthetist                         and the technician in the endoscopy suite. Mental                         Status Examination: alert and oriented. Airway                         Examination: normal oropharyngeal airway and neck                         mobility. Respiratory Examination: clear to  auscultation. CV Examination: normal. Prophylactic                         Antibiotics: The patient does not require prophylactic                         antibiotics. Prior Anticoagulants: The patient has                         taken no previous anticoagulant or  antiplatelet                         agents. ASA Grade Assessment: III - A patient with                         severe systemic disease. After reviewing the risks and                         benefits, the patient was deemed in satisfactory                         condition to undergo the procedure. The anesthesia                         plan was to use monitored anesthesia care (MAC).                         Immediately prior to administration of medications,                         the patient was re-assessed for adequacy to receive                         sedatives. The heart rate, respiratory rate, oxygen                         saturations, blood pressure, adequacy of pulmonary                         ventilation, and response to care were monitored                         throughout the procedure. The physical status of the                         patient was re-assessed after the procedure.                        After obtaining informed consent, the colonoscope was                         passed under direct vision. Throughout the procedure,                         the patient's blood pressure, pulse, and oxygen                         saturations were monitored continuously. The  Colonoscope was introduced through the anus and                         advanced to the the cecum, identified by appendiceal                         orifice and ileocecal valve. The colonoscopy was                         performed without difficulty. The patient tolerated                         the procedure well. The quality of the bowel                         preparation was fair. Findings:      The perianal and digital rectal examinations were normal.      A small amount of stool was found in the entire colon, making       visualization difficult.      A 2 mm polyp was found in the hepatic flexure. The polyp was sessile.       The polyp was removed with a jumbo cold forceps.  Resection and retrieval       were complete. Estimated blood loss was minimal.      A 2 mm polyp was found in the sigmoid colon. The polyp was sessile. The       polyp was removed with a jumbo cold forceps. Resection and retrieval       were complete. Estimated blood loss was minimal.      A tattoo was seen in the recto-sigmoid colon. The tattoo site appeared       normal.      Internal hemorrhoids were found during retroflexion. The hemorrhoids       were Grade I (internal hemorrhoids that do not prolapse).      The exam was otherwise without abnormality on direct and retroflexion       views. Impression:            - Preparation of the colon was fair.                        - Stool in the entire examined colon.                        - One 2 mm polyp at the hepatic flexure, removed with                         a jumbo cold forceps. Resected and retrieved.                        - One 2 mm polyp in the sigmoid colon, removed with a                         jumbo cold forceps. Resected and retrieved.                        - A tattoo was seen in the recto-sigmoid colon. The  tattoo site appeared normal.                        - Internal hemorrhoids.                        - The examination was otherwise normal on direct and                         retroflexion views. Recommendation:        - Discharge patient to home.                        - Resume previous diet.                        - Continue present medications.                        - Await pathology results.                        - Repeat colonoscopy in 1 year because the bowel                         preparation was suboptimal.                        - Return to referring physician as previously                         scheduled.                        - Need to clarify history of brother and see if his                         polyps were adenomatous. If so would consider genetic                          testing. Procedure Code(s):     --- Professional ---                        [phone], Colonoscopy, flexible; with biopsy, single or                         multiple Diagnosis Code(s):     --- Professional ---                        Z86.010, Personal history of colonic polyps                        K64.0, First degree hemorrhoids                        K63.5, Polyp of colon CPT copyright 2019 [employer]. All rights reserved. The codes documented in this report are preliminary and upon coder review may  be revised to meet current compliance requirements. Andrey Farmer MD, MD 09/10/2020 11:10:38 AM Number of Addenda: 0 Note Initiated On: 09/10/2020 10:29 AM Scope Withdrawal Time: 0 hours 9 minutes 52 seconds  Total Procedure Duration: 0 hours 21 minutes 5 seconds  Estimated Blood Loss:  Estimated blood loss was minimal.      [facility]

## 2020-09-10 NOTE — Anesthesia Postprocedure Evaluation (Signed)
Anesthesia Post Note  Patient: Martha Hill  Procedure(s) Performed: COLONOSCOPY WITH PROPOFOL (N/A )  Patient location during evaluation: PACU Anesthesia Type: General Level of consciousness: awake and alert and oriented Pain management: pain level controlled Vital Signs Assessment: post-procedure vital signs reviewed and stable Respiratory status: spontaneous breathing, nonlabored ventilation and respiratory function stable Cardiovascular status: blood pressure returned to baseline and stable Postop Assessment: no signs of nausea or vomiting Anesthetic complications: no   No complications documented.   Last Vitals:  Vitals:   09/10/20 1121 09/10/20 1129  BP: 110/63 126/68  Pulse: 81 84  Resp: 19 18  Temp:    SpO2: 100% 99%    Last Pain:  Vitals:   09/10/20 1129  TempSrc:   PainSc: 0-No pain                 Mariette Cowley

## 2020-09-10 NOTE — Interval H&P Note (Signed)
History and Physical Interval Note:  09/10/2020 10:38 AM  Martha Hill  has presented today for surgery, with the diagnosis of Hx of colon polyps.  The various methods of treatment have been discussed with the patient and family. After consideration of risks, benefits and other options for treatment, the patient has consented to  Procedure(s): COLONOSCOPY WITH PROPOFOL (N/A) as a surgical intervention.  The patient's history has been reviewed, patient examined, no change in status, stable for surgery.  I have reviewed the patient's chart and labs.  Questions were answered to the patient's satisfaction.     Lesly Rubenstein  Ok to proceed with colonoscopy

## 2020-09-10 NOTE — H&P (Signed)
Outpatient short stay form Pre-procedure 09/10/2020 10:35 AM Martha Miyamoto MD, MPH  Primary Physician: Dr. Sabra Heck  Reason for visit:  Hx of polyps  History of present illness:   55 y/o lady with morbid obesity, hypertension, and hld here for colonoscopy.Had colonoscopy in 2019 with 9 polyps but no pre-cancerous. History of hysterectomy and kidney surgery. Brother apparently at over 100 polyps on his most recent colonoscopy but doesn't know if genetic testing was performed. No blood thinners.    Current Facility-Administered Medications:  .  0.9 %  sodium chloride infusion, , Intravenous, Continuous, Shawne Eskelson, Hilton Cork, MD, Last Rate: 20 mL/hr at 09/10/20 0953, New Bag at 09/10/20 0953  Medications Prior to Admission  Medication Sig Dispense Refill Last Dose  . Fluticasone-Salmeterol (ADVAIR) 250-50 MCG/DOSE AEPB Inhale 1 puff into the lungs 2 (two) times daily.   09/08/2020  . gabapentin (NEURONTIN) 300 MG capsule Take 300 mg by mouth 3 (three) times daily.   09/08/2020  . albuterol (PROVENTIL HFA;VENTOLIN HFA) 108 (90 Base) MCG/ACT inhaler Inhale into the lungs every 6 (six) hours as needed for wheezing or shortness of breath.     Marland Kitchen atorvastatin (LIPITOR) 40 MG tablet Take 1 tablet (40 mg total) by mouth daily. (Patient not taking: Reported on 09/09/2020) 30 tablet 0 Not Taking at Unknown time  . Cholecalciferol 50 MCG (2000 UT) CAPS Take 2,000 Units by mouth daily at 6 (six) AM.   09/08/2020  . cyanocobalamin 2000 MCG tablet Take 1 tablet (2,000 mcg total) by mouth daily.   09/08/2020  . furosemide (LASIX) 20 MG tablet Take 20 mg by mouth daily.   09/08/2020  . glimepiride (AMARYL) 2 MG tablet Take 2 mg by mouth daily.   09/08/2020  . lisinopril (ZESTRIL) 20 MG tablet Take 20 mg by mouth daily.   09/08/2020  . Magnesium 250 MG TABS Take 250 mg by mouth daily at 6 (six) AM.   09/08/2020  . meloxicam (MOBIC) 7.5 MG tablet Take 7.5 mg by mouth daily. (Patient not taking: Reported on 09/10/2020)   Not Taking  at Unknown time  . metFORMIN (GLUCOPHAGE) 500 MG tablet TAKE ONE TABLET BY MOUTH TWICE DAILY BEFORE MEAL(S) (Patient not taking: Reported on 09/10/2020)   Not Taking at Unknown time  . nicotine (NICODERM CQ - DOSED IN MG/24 HOURS) 21 mg/24hr patch Place 1 patch (21 mg total) onto the skin daily. 28 patch 0   . omeprazole (PRILOSEC) 20 MG capsule Take 1 capsule (20 mg total) by mouth 2 (two) times daily.   09/08/2020  . phenazopyridine (PYRIDIUM) 100 MG tablet Take 1 tablet (100 mg total) by mouth 3 (three) times daily with meals. 10 tablet 0   . potassium chloride (K-DUR) 10 MEQ tablet TAKE ONE TABLET BY MOUTH THREE TIMES DAILY   09/08/2020  . pramipexole (MIRAPEX) 0.25 MG tablet Take 0.25 mg by mouth at bedtime.   09/08/2020     Allergies  Allergen Reactions  . Eucalyptus Flavor [Flavoring Agent] Shortness Of Breath  . Macrolides And Ketolides Nausea And Vomiting    Azithromycin/erythromycin  . Salmon Oil [Nutritional Supplements] Swelling  . Sulfa Antibiotics Other (See Comments)    Contact dermatitis due to intravaginal preparation  . Glipizide Nausea And Vomiting  . Metformin And Related Diarrhea  . Tramadol Hcl   . Zithromax [Azithromycin]      Past Medical History:  Diagnosis Date  . Arthritis   . DDD (degenerative disc disease), lumbar   . Diabetes mellitus without complication (  [place])   . Family history of adverse reaction to anesthesia    children have nausea with anesthesia  . Family history of breast cancer   . Family history of colon cancer   . Family history of colonic polyps   . Family history of lung cancer   . GERD (gastroesophageal reflux disease)   . Gout   . Hamartomatous polyp of large intestine ([place])   . History of kidney cancer   . HLD (hyperlipidemia)   . Hypertension   . Personal history of colonic polyps   . PONV (postoperative nausea and vomiting)   . Renal cancer ([place]) 2008  . RLS (restless legs syndrome)   . Sleep apnea     Review of systems:   Otherwise negative.    Physical Exam  Gen: Alert, oriented. Appears stated age.  HEENT: PERRLA. Lungs: No respiratory distress CV: RRR Abd: soft, benign, no masses Ext: No edema    Planned procedures: Proceed with colonoscopy. The patient understands the nature of the planned procedure, indications, risks, alternatives and potential complications including but not limited to bleeding, infection, perforation, damage to internal organs and possible oversedation/side effects from anesthesia. The patient agrees and gives consent to proceed.  Please refer to procedure notes for findings, recommendations and patient disposition/instructions.     Martha Miyamoto MD, MPH Gastroenterology 09/10/2020  10:35 AM

## 2020-09-10 NOTE — Anesthesia Preprocedure Evaluation (Signed)
Anesthesia Evaluation  Patient identified by MRN, date of birth, ID band Patient awake    Reviewed: Allergy & Precautions, NPO status , Patient's Chart, lab work & pertinent test results  History of Anesthesia Complications (+) PONV and history of anesthetic complications  Airway Mallampati: III  TM Distance: >3 FB Neck ROM: Full    Dental  (+) Edentulous Upper, Edentulous Lower   Pulmonary sleep apnea (does not wear CPAP because it makes her cough) , Current SmokerPatient did not abstain from smoking.,    breath sounds clear to auscultation- rhonchi (-) wheezing      Cardiovascular hypertension, Pt. on medications (-) CAD, (-) Past MI, (-) Cardiac Stents and (-) CABG  Rhythm:Regular Rate:Normal - Systolic murmurs and - Diastolic murmurs    Neuro/Psych neg Seizures negative neurological ROS  negative psych ROS   GI/Hepatic Neg liver ROS, GERD  ,  Endo/Other  diabetes, Oral Hypoglycemic Agents  Renal/GU Renal disease (hx of renal cancer)     Musculoskeletal  (+) Arthritis ,   Abdominal (+) + obese,   Peds  Hematology negative hematology ROS (+)   Anesthesia Other Findings Past Medical History: No date: Arthritis No date: DDD (degenerative disc disease), lumbar No date: Diabetes mellitus without complication (H[facility]) No date: Family history of adverse reaction to anesthesia     Comment:  children have nausea with anesthesia No date: Family history of breast cancer No date: Family history of colon cancer No date: Family history of colonic polyps No date: Family history of lung cancer No date: GERD (gastroesophageal reflux disease) No date: Gout No date: Hamartomatous polyp of large intestine (H[facility]) No date: History of kidney cancer No date: HLD (hyperlipidemia) No date: Hypertension No date: Personal history of colonic polyps No date: PONV (postoperative nausea and vomiting) 2008: Renal cancer ([place]) No date: RLS  (restless legs syndrome) No date: Sleep apnea   Reproductive/Obstetrics                             Anesthesia Physical Anesthesia Plan  ASA: III  Anesthesia Plan: General   Post-op Pain Management:    Induction: Intravenous  PONV Risk Score and Plan: 2 and Propofol infusion  Airway Management Planned: Natural Airway  Additional Equipment:   Intra-op Plan:   Post-operative Plan:   Informed Consent: I have reviewed the patients History and Physical, chart, labs and discussed the procedure including the risks, benefits and alternatives for the proposed anesthesia with the patient or authorized representative who has indicated his/her understanding and acceptance.     Dental advisory given  Plan Discussed with: CRNA and Anesthesiologist  Anesthesia Plan Comments:         Anesthesia Quick Evaluation

## 2020-09-10 NOTE — Transfer of Care (Signed)
Immediate Anesthesia Transfer of Care Note  Patient: Martha Hill  Procedure(s) Performed: COLONOSCOPY WITH PROPOFOL (N/A )  Patient Location: PACU  Anesthesia Type:General  Level of Consciousness: sedated  Airway & Oxygen Therapy: Patient Spontanous Breathing  Post-op Assessment: Report given to RN and Post -op Vital signs reviewed and stable  Post vital signs: Reviewed and stable  Last Vitals:  Vitals Value Taken Time  BP 107/63 09/10/20 1109  Temp    Pulse 87 09/10/20 1109  Resp 15 09/10/20 1109  SpO2 97 % 09/10/20 1109  Vitals shown include unvalidated device data.  Last Pain:  Vitals:   09/10/20 0935  TempSrc: Temporal  PainSc: 0-No pain         Complications: No complications documented.

## 2020-09-10 NOTE — Progress Notes (Signed)
No risk

## 2020-09-13 ENCOUNTER — Encounter: Payer: Self-pay | Admitting: Gastroenterology

## 2020-09-13 LAB — SURGICAL PATHOLOGY

## 2021-11-06 ENCOUNTER — Inpatient Hospital Stay
Admission: EM | Admit: 2021-11-06 | Discharge: 2021-11-09 | DRG: 373 | Disposition: A | Discharge status: Home / Self Care | Payer: Medicaid Other | Attending: Hospitalist | Admitting: Hospitalist

## 2021-11-06 ENCOUNTER — Other Ambulatory Visit: Payer: Self-pay

## 2021-11-06 DIAGNOSIS — Z882 Allergy status to sulfonamides status: Secondary | ICD-10-CM

## 2021-11-06 DIAGNOSIS — K529 Noninfective gastroenteritis and colitis, unspecified: Principal | ICD-10-CM | POA: Diagnosis present

## 2021-11-06 DIAGNOSIS — M109 Gout, unspecified: Secondary | ICD-10-CM | POA: Diagnosis present

## 2021-11-06 DIAGNOSIS — J45909 Unspecified asthma, uncomplicated: Secondary | ICD-10-CM | POA: Diagnosis present

## 2021-11-06 DIAGNOSIS — Z720 Tobacco use: Secondary | ICD-10-CM | POA: Diagnosis present

## 2021-11-06 DIAGNOSIS — Z79899 Other long term (current) drug therapy: Secondary | ICD-10-CM

## 2021-11-06 DIAGNOSIS — R3129 Other microscopic hematuria: Secondary | ICD-10-CM

## 2021-11-06 DIAGNOSIS — Z881 Allergy status to other antibiotic agents status: Secondary | ICD-10-CM

## 2021-11-06 DIAGNOSIS — K219 Gastro-esophageal reflux disease without esophagitis: Secondary | ICD-10-CM | POA: Diagnosis present

## 2021-11-06 DIAGNOSIS — R112 Nausea with vomiting, unspecified: Secondary | ICD-10-CM

## 2021-11-06 DIAGNOSIS — E119 Type 2 diabetes mellitus without complications: Secondary | ICD-10-CM | POA: Diagnosis present

## 2021-11-06 DIAGNOSIS — Z85528 Personal history of other malignant neoplasm of kidney: Secondary | ICD-10-CM

## 2021-11-06 DIAGNOSIS — E785 Hyperlipidemia, unspecified: Secondary | ICD-10-CM | POA: Diagnosis present

## 2021-11-06 DIAGNOSIS — E876 Hypokalemia: Secondary | ICD-10-CM | POA: Diagnosis present

## 2021-11-06 DIAGNOSIS — Z888 Allergy status to other drugs, medicaments and biological substances status: Secondary | ICD-10-CM

## 2021-11-06 DIAGNOSIS — F17210 Nicotine dependence, cigarettes, uncomplicated: Secondary | ICD-10-CM | POA: Diagnosis present

## 2021-11-06 DIAGNOSIS — G4733 Obstructive sleep apnea (adult) (pediatric): Secondary | ICD-10-CM | POA: Diagnosis present

## 2021-11-06 DIAGNOSIS — Z28310 Unvaccinated for covid-19: Secondary | ICD-10-CM

## 2021-11-06 DIAGNOSIS — Z9102 Food additives allergy status: Secondary | ICD-10-CM

## 2021-11-06 DIAGNOSIS — Z7984 Long term (current) use of oral hypoglycemic drugs: Secondary | ICD-10-CM

## 2021-11-06 DIAGNOSIS — I10 Essential (primary) hypertension: Secondary | ICD-10-CM | POA: Diagnosis present

## 2021-11-06 DIAGNOSIS — A020 Salmonella enteritis: Principal | ICD-10-CM | POA: Diagnosis present

## 2021-11-06 LAB — COMPREHENSIVE METABOLIC PANEL
ALT: 16 U/L (ref 0–44)
AST: 22 U/L (ref 15–41)
Albumin: 3.7 g/dL (ref 3.5–5.0)
Alkaline Phosphatase: 38 U/L (ref 38–126)
Anion gap: 10 (ref 5–15)
BUN: 26 mg/dL — ABNORMAL HIGH (ref 6–20)
CO2: 27 mmol/L (ref 22–32)
Calcium: 9.3 mg/dL (ref 8.9–10.3)
Chloride: 95 mmol/L — ABNORMAL LOW (ref 98–111)
Creatinine, Ser: 1.19 mg/dL — ABNORMAL HIGH (ref 0.44–1.00)
GFR, Estimated: 53 mL/min — ABNORMAL LOW (ref >60)
Glucose, Bld: 131 mg/dL — ABNORMAL HIGH (ref 70–99)
Potassium: 3.4 mmol/L — ABNORMAL LOW (ref 3.5–5.1)
Sodium: 132 mmol/L — ABNORMAL LOW (ref 135–145)
Total Bilirubin: 0.8 mg/dL (ref 0.3–1.2)
Total Protein: 8.2 g/dL — ABNORMAL HIGH (ref 6.5–8.1)

## 2021-11-06 LAB — CBC
HCT: 46.4 % — ABNORMAL HIGH (ref 36.0–46.0)
Hemoglobin: 15.3 g/dL — ABNORMAL HIGH (ref 12.0–15.0)
MCH: 26.1 pg (ref 26.0–34.0)
MCHC: 33 g/dL (ref 30.0–36.0)
MCV: 79 fL — ABNORMAL LOW (ref 80.0–100.0)
Platelets: 195 10*3/uL (ref 150–400)
RBC: 5.87 MIL/uL — ABNORMAL HIGH (ref 3.87–5.11)
RDW: 14.9 % (ref 11.5–15.5)
WBC: 8.3 10*3/uL (ref 4.0–10.5)
nRBC: 0 % (ref 0.0–0.2)

## 2021-11-06 LAB — LIPASE, BLOOD: Lipase: 28 U/L (ref 11–51)

## 2021-11-06 MED ORDER — FENTANYL CITRATE PF 50 MCG/ML IJ SOSY
50.0000 ug | PREFILLED_SYRINGE | Freq: Once | INTRAMUSCULAR | Status: AC
  Administered 2021-11-06: 50 ug via INTRAVENOUS
  Filled 2021-11-06: qty 1

## 2021-11-06 MED ORDER — FAMOTIDINE IN NACL 20-0.9 MG/50ML-% IV SOLN
20.0000 mg | Freq: Once | INTRAVENOUS | Status: AC
  Administered 2021-11-07: 20 mg via INTRAVENOUS
  Filled 2021-11-06: qty 50

## 2021-11-06 MED ORDER — ONDANSETRON HCL 4 MG/2ML IJ SOLN
4.0000 mg | Freq: Once | INTRAMUSCULAR | Status: AC
  Administered 2021-11-06: 4 mg via INTRAVENOUS
  Filled 2021-11-06: qty 2

## 2021-11-06 MED ORDER — LACTATED RINGERS IV BOLUS
1000.0000 mL | Freq: Once | INTRAVENOUS | Status: AC
  Administered 2021-11-07: 1000 mL via INTRAVENOUS

## 2021-11-06 NOTE — ED Triage Notes (Signed)
Pt complains of vomiting and diarrhea that began three days ago, pt also complains of lower abd pain. Pt denies known fever, but states has had chills. Pt appears in no acute distress.

## 2021-11-06 NOTE — ED Provider Notes (Signed)
[facility] Provider Note    Event Date/Time   First MD Initiated Contact with Patient 11/06/21 2306     (approximate)   History   Abdominal Pain   HPI  Martha Hill is a 56 y.o. female with a history of hypertension, GERD, OSA, diabetes who presents for evaluation of abdominal pain, vomiting and diarrhea.  Patient reports that her symptoms started 2 days ago.  The night before she went to a [employer]  She reports that the food is very cold and they saw lots of cockroaches walking around the restaurant.  The next morning patient started feeling sick with generalized crampy abdominal pain and several daily episodes of watery diarrhea nonbloody nonbilious emesis.  No recent antibiotic use or history of C. difficile.  She is complaining of diffuse cramping abdominal pain.  No chest pain, no shortness of breath, no fever, no melena, no hematemesis, no hematochezia, no coffee-ground emesis.  She is also complaining of dysuria that started yesterday.     Past Medical History:  Diagnosis Date   Arthritis    DDD (degenerative disc disease), lumbar    Diabetes mellitus without complication ([place])    Family history of adverse reaction to anesthesia    children have nausea with anesthesia   Family history of breast cancer    Family history of colon cancer    Family history of colonic polyps    Family history of lung cancer    GERD (gastroesophageal reflux disease)    Gout    Hamartomatous polyp of large intestine ([place])    History of kidney cancer    HLD (hyperlipidemia)    Hypertension    Personal history of colonic polyps    PONV (postoperative nausea and vomiting)    Renal cancer ([place]) 2008   RLS (restless legs syndrome)    Sleep apnea     Past Surgical History:  Procedure Laterality Date   ABDOMINAL HYSTERECTOMY     bladder tack     CHOLECYSTECTOMY     COLONOSCOPY WITH PROPOFOL N/A 01/15/2018   Procedure: COLONOSCOPY WITH PROPOFOL;   Surgeon: Lollie Sails, MD;  Location: [facility] ENDOSCOPY;  Service: Endoscopy;  Laterality: N/A;   COLONOSCOPY WITH PROPOFOL N/A 02/11/2018   Procedure: COLONOSCOPY WITH PROPOFOL;  Surgeon: Lollie Sails, MD;  Location: [facility] ENDOSCOPY;  Service: Endoscopy;  Laterality: N/A;   COLONOSCOPY WITH PROPOFOL N/A 09/10/2020   Procedure: COLONOSCOPY WITH PROPOFOL;  Surgeon: Lesly Rubenstein, MD;  Location: [facility] ENDOSCOPY;  Service: Endoscopy;  Laterality: N/A;   mastoid surgery for mastoiditis     ROBOTIC ASSITED PARTIAL NEPHRECTOMY Left 2008   TONSILLECTOMY     TUBAL LIGATION       Physical Exam   Triage Vital Signs: ED Triage Vitals  Enc Vitals Group     BP 11/06/21 2227 101/82     Pulse Rate 11/06/21 2227 94     Resp 11/06/21 2227 16     Temp 11/06/21 2227 98 F (36.7 C)     Temp Source 11/06/21 2227 Oral     SpO2 11/06/21 2227 96 %     Weight 11/06/21 2225 219 lb (99.3 kg)     Height 11/06/21 2225 5' (1.524 m)     Head Circumference --      Peak Flow --      Pain Score 11/06/21 2225 8     Pain Loc --      Pain Edu? --  Excl. in [place]? --     Most recent vital signs: Vitals:   11/07/21 0130 11/07/21 0239  BP:  115/70  Pulse: 86 91  Resp:  14  Temp:    SpO2: 95% 95%     Constitutional: Alert and oriented. Well appearing and in no apparent distress. HEENT:      Head: Normocephalic and atraumatic.         Eyes: Conjunctivae are normal. Sclera is non-icteric.       Mouth/Throat: Mucous membranes are moist.       Neck: Supple with no signs of meningismus. Cardiovascular: Regular rate and rhythm. No murmurs, gallops, or rubs. 2+ symmetrical distal pulses are present in all extremities.  Respiratory: Normal respiratory effort. Lungs are clear to auscultation bilaterally.  Gastrointestinal: Soft, mild diffuse tenderness,  and non distended with positive bowel sounds. No rebound or guarding. Genitourinary: No CVA tenderness. Musculoskeletal:  No edema, cyanosis, or  erythema of extremities. Neurologic: Normal speech and language. Face is symmetric. Moving all extremities. No gross focal neurologic deficits are appreciated. Skin: Skin is warm, dry and intact. No rash noted. Psychiatric: Mood and affect are normal. Speech and behavior are normal.  ED Results / Procedures / Treatments   Labs (all labs ordered are listed, but only abnormal results are displayed) Labs Reviewed  COMPREHENSIVE METABOLIC PANEL - Abnormal; Notable for the following components:      Result Value   Sodium 132 (*)    Potassium 3.4 (*)    Chloride 95 (*)    Glucose, Bld 131 (*)    BUN 26 (*)    Creatinine, Ser 1.19 (*)    Total Protein 8.2 (*)    GFR, Estimated 53 (*)    All other components within normal limits  CBC - Abnormal; Notable for the following components:   RBC 5.87 (*)    Hemoglobin 15.3 (*)    HCT 46.4 (*)    MCV 79.0 (*)    All other components within normal limits  C DIFFICILE QUICK SCREEN W PCR REFLEX    LIPASE, BLOOD  URINALYSIS, ROUTINE W REFLEX MICROSCOPIC     EKG  none   RADIOLOGY I, Rudene Re, attending MD, have personally viewed and interpreted the images obtained during this visit as below:  CT concerning for colitis   ___________________________________________________ Interpretation by Radiologist:  CT ABDOMEN PELVIS W CONTRAST  Result Date: 11/07/2021 CLINICAL DATA:  Vomiting diarrhea lower abdominal pain EXAM: CT ABDOMEN AND PELVIS WITH CONTRAST TECHNIQUE: Multidetector CT imaging of the abdomen and pelvis was performed using the standard protocol following bolus administration of intravenous contrast. RADIATION DOSE REDUCTION: This exam was performed according to the departmental dose-optimization program which includes automated exposure control, adjustment of the mA and/or kV according to patient size and/or use of iterative reconstruction technique. CONTRAST:  167m OMNIPAQUE IOHEXOL 300 MG/ML  SOLN COMPARISON:  CT  01/23/2019 FINDINGS: Lower chest: No acute abnormality. Hepatobiliary: Nodular hepatic contour, suspicious for cirrhosis. Status post cholecystectomy. Prominent extrahepatic common bile duct likely due to postsurgical change. Pancreas: Unremarkable. No pancreatic ductal dilatation or surrounding inflammatory changes. Spleen: Normal in size without focal abnormality. Adrenals/Urinary Tract: Adrenal glands are within normal limits. Kidneys show no hydronephrosis. Cyst lower pole left kidney, no follow-up imaging is recommended. The bladder is unremarkable Stomach/Bowel: The stomach is nonenlarged. No dilated small bowel. Fluid-filled colon with mild diffuse wall thickening, suspicious for a colitis. Appendix within normal limits Vascular/Lymphatic: Moderate aortic atherosclerosis. No aneurysm. No suspicious lymph  nodes Reproductive: Status post hysterectomy. No adnexal masses. Other: Negative for pelvic effusion or free air Musculoskeletal: No acute osseous abnormality IMPRESSION: 1. Diffuse fluid in the colon consistent with history of diarrhea. Mild diffuse wall thickening is suspicious for pancolitis of infectious or inflammatory etiology. 2. Liver cirrhosis Electronically Signed   By: Donavan Foil M.D.   On: 11/07/2021 02:49       PROCEDURES:  Critical Care performed: No  Procedures    IMPRESSION / MDM / ASSESSMENT AND PLAN / ED COURSE  I reviewed the triage vital signs and the nursing notes.  57 y.o. female with a history of hypertension, GERD, OSA, diabetes who presents for evaluation of abdominal pain, vomiting and diarrhea.  Symptoms started 2 days ago in the morning after patient went to a buffet restaurant the night before with very poor hygiene.  She looks uncomfortable but in no significant distress, afebrile, stable vital signs, abdomen is soft with mild diffuse tenderness.  No localized tenderness, rebound or guarding  Ddx: Food poisoning versus gastroenteritis versus colitis versus C.  difficile versus pancreatitis versus diverticulitis   Plan: CBC, CMP, lipase, urinalysis.  We will treat symptoms with IV fluids, IV Pepcid, IV Zofran, and IV fentanyl.   MEDICATIONS GIVEN IN ED: Medications  fentaNYL (SUBLIMAZE) injection 50 mcg (has no administration in time range)  lactated ringers bolus 1,000 mL (0 mLs Intravenous Stopped 11/07/21 0218)  ondansetron (ZOFRAN) injection 4 mg (4 mg Intravenous Given 11/06/21 2357)  famotidine (PEPCID) IVPB 20 mg premix (0 mg Intravenous Stopped 11/07/21 0031)  fentaNYL (SUBLIMAZE) injection 50 mcg (50 mcg Intravenous Given 11/06/21 2358)  metoCLOPramide (REGLAN) injection 10 mg (10 mg Intravenous Given 11/07/21 0209)  lactated ringers bolus 1,000 mL (1,000 mLs Intravenous New Bag/Given 11/07/21 0218)  iohexol (OMNIPAQUE) 300 MG/ML solution 100 mL (100 mLs Intravenous Contrast Given 11/07/21 0217)  piperacillin-tazobactam (ZOSYN) IVPB 3.375 g (0 g Intravenous Stopped 11/07/21 0429)     ED COURSE: Patient received IV fentanyl, Pepcid, Zofran and fluids and continued to complain of pain, nausea and vomiting therefore she was sent for CT abdomen pelvis which shows colitis.  Patient then received another round of antiemetics with Reglan this time around and Zosyn.  C. difficile is negative.  Labs showing no significant electrolyte derangements or AKI, no signs of sepsis, normal LFTs and lipase.  Patient was reassessed after Zosyn and continues to complain of pretty significant pain and nausea therefore hospitalist service was consulted for intractable pain and nausea and after discussion patient has been accepted to their service   Consults: Hospitalist   EMR reviewed including records from her last visit with her primary care doctor from a month ago for UTI    FINAL CLINICAL IMPRESSION(S) / ED DIAGNOSES   Final diagnoses:  Colitis  Intractable nausea and vomiting     Rx / DC Orders   ED Discharge Orders     None        Note:  This  document was prepared using Dragon voice recognition software and may include unintentional dictation errors.   Please note:  Patient was evaluated in Emergency Department today for the symptoms described in the history of present illness. Patient was evaluated in the context of the global COVID-19 pandemic, which necessitated consideration that the patient might be at risk for infection with the SARS-CoV-2 virus that causes COVID-19. Institutional protocols and algorithms that pertain to the evaluation of patients at risk for COVID-19 are in a state of rapid change  based on information released by regulatory bodies including the CDC and federal and state organizations. These policies and algorithms were followed during the patient's care in the ED.  Some ED evaluations and interventions may be delayed as a result of limited staffing during the pandemic.       Alfred Levins, [place], MD 11/07/21 [phone]

## 2021-11-07 ENCOUNTER — Encounter: Payer: Self-pay | Admitting: Family Medicine

## 2021-11-07 ENCOUNTER — Emergency Department: Payer: Medicaid Other

## 2021-11-07 DIAGNOSIS — E876 Hypokalemia: Secondary | ICD-10-CM

## 2021-11-07 DIAGNOSIS — F17210 Nicotine dependence, cigarettes, uncomplicated: Secondary | ICD-10-CM | POA: Diagnosis present

## 2021-11-07 DIAGNOSIS — Z881 Allergy status to other antibiotic agents status: Secondary | ICD-10-CM | POA: Diagnosis not present

## 2021-11-07 DIAGNOSIS — K529 Noninfective gastroenteritis and colitis, unspecified: Principal | ICD-10-CM | POA: Diagnosis present

## 2021-11-07 DIAGNOSIS — Z720 Tobacco use: Secondary | ICD-10-CM | POA: Diagnosis not present

## 2021-11-07 DIAGNOSIS — Z79899 Other long term (current) drug therapy: Secondary | ICD-10-CM | POA: Diagnosis not present

## 2021-11-07 DIAGNOSIS — K219 Gastro-esophageal reflux disease without esophagitis: Secondary | ICD-10-CM

## 2021-11-07 DIAGNOSIS — Z85528 Personal history of other malignant neoplasm of kidney: Secondary | ICD-10-CM | POA: Diagnosis not present

## 2021-11-07 DIAGNOSIS — Z888 Allergy status to other drugs, medicaments and biological substances status: Secondary | ICD-10-CM | POA: Diagnosis not present

## 2021-11-07 DIAGNOSIS — A020 Salmonella enteritis: Secondary | ICD-10-CM | POA: Diagnosis present

## 2021-11-07 DIAGNOSIS — Z28310 Unvaccinated for covid-19: Secondary | ICD-10-CM | POA: Diagnosis not present

## 2021-11-07 DIAGNOSIS — G4733 Obstructive sleep apnea (adult) (pediatric): Secondary | ICD-10-CM | POA: Diagnosis present

## 2021-11-07 DIAGNOSIS — Z882 Allergy status to sulfonamides status: Secondary | ICD-10-CM | POA: Diagnosis not present

## 2021-11-07 DIAGNOSIS — E119 Type 2 diabetes mellitus without complications: Secondary | ICD-10-CM | POA: Diagnosis present

## 2021-11-07 DIAGNOSIS — M109 Gout, unspecified: Secondary | ICD-10-CM | POA: Diagnosis present

## 2021-11-07 DIAGNOSIS — I10 Essential (primary) hypertension: Secondary | ICD-10-CM | POA: Diagnosis present

## 2021-11-07 DIAGNOSIS — J45909 Unspecified asthma, uncomplicated: Secondary | ICD-10-CM | POA: Diagnosis present

## 2021-11-07 DIAGNOSIS — Z7984 Long term (current) use of oral hypoglycemic drugs: Secondary | ICD-10-CM | POA: Diagnosis not present

## 2021-11-07 DIAGNOSIS — E785 Hyperlipidemia, unspecified: Secondary | ICD-10-CM | POA: Diagnosis present

## 2021-11-07 DIAGNOSIS — Z9102 Food additives allergy status: Secondary | ICD-10-CM | POA: Diagnosis not present

## 2021-11-07 LAB — GASTROINTESTINAL PANEL BY PCR, STOOL (REPLACES STOOL CULTURE)

## 2021-11-07 LAB — C DIFFICILE QUICK SCREEN W PCR REFLEX
C Diff antigen: NEGATIVE
C Diff interpretation: NOT DETECTED
C Diff toxin: NEGATIVE

## 2021-11-07 LAB — CBG MONITORING, ED
Glucose-Capillary: 123 mg/dL — ABNORMAL HIGH (ref 70–99)
Glucose-Capillary: 132 mg/dL — ABNORMAL HIGH (ref 70–99)

## 2021-11-07 LAB — HEMOGLOBIN A1C
Hgb A1c MFr Bld: 5.8 % — ABNORMAL HIGH (ref 4.8–5.6)
Mean Plasma Glucose: 119.76 mg/dL

## 2021-11-07 LAB — GLUCOSE, CAPILLARY: Glucose-Capillary: 118 mg/dL — ABNORMAL HIGH (ref 70–99)

## 2021-11-07 MED ORDER — METOCLOPRAMIDE HCL 5 MG/ML IJ SOLN
10.0000 mg | Freq: Once | INTRAMUSCULAR | Status: AC
  Administered 2021-11-07: 10 mg via INTRAVENOUS
  Filled 2021-11-07: qty 2

## 2021-11-07 MED ORDER — GABAPENTIN 300 MG PO CAPS
300.0000 mg | ORAL_CAPSULE | Freq: Three times a day (TID) | ORAL | Status: DC
Start: 2021-11-07 — End: 2021-11-07

## 2021-11-07 MED ORDER — INSULIN ASPART 100 UNIT/ML IJ SOLN
0.0000 [IU] | Freq: Three times a day (TID) | INTRAMUSCULAR | Status: DC
  Administered 2021-11-07 – 2021-11-08 (×4): 2 [IU] via SUBCUTANEOUS
  Filled 2021-11-07 (×3): qty 1

## 2021-11-07 MED ORDER — LISINOPRIL 10 MG PO TABS
10.0000 mg | ORAL_TABLET | Freq: Every day | ORAL | Status: DC
  Administered 2021-11-08: 10 mg via ORAL
  Filled 2021-11-07 (×2): qty 1

## 2021-11-07 MED ORDER — METRONIDAZOLE 500 MG/100ML IV SOLN
500.0000 mg | Freq: Two times a day (BID) | INTRAVENOUS | Status: DC
Start: 2021-11-07 — End: 2021-11-09
  Administered 2021-11-07 – 2021-11-09 (×4): 500 mg via INTRAVENOUS
  Filled 2021-11-07 (×5): qty 100

## 2021-11-07 MED ORDER — SODIUM CHLORIDE 0.9 % IV SOLN: INTRAVENOUS | Status: DC

## 2021-11-07 MED ORDER — PIPERACILLIN-TAZOBACTAM 3.375 G IVPB 30 MIN
3.3750 g | Freq: Once | INTRAVENOUS | Status: AC
  Administered 2021-11-07: 3.375 g via INTRAVENOUS
  Filled 2021-11-07: qty 50

## 2021-11-07 MED ORDER — ACETAMINOPHEN 325 MG PO TABS: 650.0000 mg | ORAL_TABLET | Freq: Four times a day (QID) | ORAL | Status: DC | PRN

## 2021-11-07 MED ORDER — MORPHINE SULFATE (PF) 2 MG/ML IV SOLN: 2.0000 mg | INTRAVENOUS | Status: DC | PRN

## 2021-11-07 MED ORDER — ALBUTEROL SULFATE (2.5 MG/3ML) 0.083% IN NEBU: 3.0000 mL | INHALATION_SOLUTION | Freq: Four times a day (QID) | RESPIRATORY_TRACT | Status: DC | PRN

## 2021-11-07 MED ORDER — ATORVASTATIN CALCIUM 20 MG PO TABS: 40.0000 mg | ORAL_TABLET | Freq: Every day | ORAL | Status: DC

## 2021-11-07 MED ORDER — LOPERAMIDE HCL 2 MG PO CAPS
2.0000 mg | ORAL_CAPSULE | ORAL | Status: DC | PRN
  Administered 2021-11-07 – 2021-11-08 (×6): 2 mg via ORAL
  Filled 2021-11-07 (×6): qty 1

## 2021-11-07 MED ORDER — ASPIRIN 81 MG PO TBEC
81.0000 mg | DELAYED_RELEASE_TABLET | Freq: Every day | ORAL | Status: DC
  Administered 2021-11-07 – 2021-11-09 (×3): 81 mg via ORAL
  Filled 2021-11-07 (×3): qty 1

## 2021-11-07 MED ORDER — ONDANSETRON HCL 4 MG PO TABS: 4.0000 mg | ORAL_TABLET | Freq: Four times a day (QID) | ORAL | Status: DC | PRN

## 2021-11-07 MED ORDER — OXYCODONE HCL 5 MG PO TABS
5.0000 mg | ORAL_TABLET | ORAL | Status: DC | PRN
  Administered 2021-11-07: 5 mg via ORAL
  Filled 2021-11-07: qty 1

## 2021-11-07 MED ORDER — POTASSIUM CHLORIDE 10 MEQ/100ML IV SOLN
10.0000 meq | INTRAVENOUS | Status: DC
  Administered 2021-11-07: 10 meq via INTRAVENOUS
  Filled 2021-11-07: qty 100

## 2021-11-07 MED ORDER — HEPARIN SODIUM (PORCINE) 5000 UNIT/ML IJ SOLN
5000.0000 [IU] | Freq: Three times a day (TID) | INTRAMUSCULAR | Status: DC
  Administered 2021-11-07 – 2021-11-09 (×5): 5000 [IU] via SUBCUTANEOUS
  Filled 2021-11-07 (×6): qty 1

## 2021-11-07 MED ORDER — METRONIDAZOLE 500 MG/100ML IV SOLN: 500.0000 mg | Freq: Two times a day (BID) | INTRAVENOUS | Status: DC

## 2021-11-07 MED ORDER — ACETAMINOPHEN 650 MG RE SUPP: 650.0000 mg | Freq: Four times a day (QID) | RECTAL | Status: DC | PRN

## 2021-11-07 MED ORDER — PANTOPRAZOLE SODIUM 40 MG PO TBEC
40.0000 mg | DELAYED_RELEASE_TABLET | Freq: Every day | ORAL | Status: DC
  Administered 2021-11-07 – 2021-11-09 (×3): 40 mg via ORAL
  Filled 2021-11-07 (×3): qty 1

## 2021-11-07 MED ORDER — INSULIN ASPART 100 UNIT/ML IJ SOLN: 0.0000 [IU] | Freq: Every day | INTRAMUSCULAR | Status: DC

## 2021-11-07 MED ORDER — CIPROFLOXACIN IN D5W 400 MG/200ML IV SOLN
400.0000 mg | Freq: Two times a day (BID) | INTRAVENOUS | Status: DC
  Administered 2021-11-07 – 2021-11-09 (×4): 400 mg via INTRAVENOUS
  Filled 2021-11-07 (×5): qty 200

## 2021-11-07 MED ORDER — IOHEXOL 300 MG/ML  SOLN
100.0000 mL | Freq: Once | INTRAMUSCULAR | Status: AC | PRN
  Administered 2021-11-07: 100 mL via INTRAVENOUS

## 2021-11-07 MED ORDER — POTASSIUM CHLORIDE CRYS ER 20 MEQ PO TBCR
40.0000 meq | EXTENDED_RELEASE_TABLET | Freq: Once | ORAL | Status: AC
  Administered 2021-11-07: 40 meq via ORAL

## 2021-11-07 MED ORDER — PRAMIPEXOLE DIHYDROCHLORIDE 0.25 MG PO TABS
0.2500 mg | ORAL_TABLET | Freq: Every day | ORAL | Status: DC
Start: 2021-11-07 — End: 2021-11-08
  Administered 2021-11-07: 0.25 mg via ORAL
  Filled 2021-11-07: qty 1

## 2021-11-07 MED ORDER — FENTANYL CITRATE PF 50 MCG/ML IJ SOSY
50.0000 ug | PREFILLED_SYRINGE | Freq: Once | INTRAMUSCULAR | Status: AC
  Administered 2021-11-07: 50 ug via INTRAVENOUS
  Filled 2021-11-07: qty 1

## 2021-11-07 MED ORDER — LISINOPRIL 10 MG PO TABS
20.0000 mg | ORAL_TABLET | Freq: Every day | ORAL | Status: DC
  Administered 2021-11-07: 10 mg via ORAL
  Filled 2021-11-07: qty 2

## 2021-11-07 MED ORDER — POTASSIUM CHLORIDE CRYS ER 20 MEQ PO TBCR
EXTENDED_RELEASE_TABLET | ORAL | Status: AC
  Filled 2021-11-07: qty 2

## 2021-11-07 MED ORDER — LACTATED RINGERS IV BOLUS
1000.0000 mL | Freq: Once | INTRAVENOUS | Status: AC
  Administered 2021-11-07: 1000 mL via INTRAVENOUS

## 2021-11-07 MED ORDER — ONDANSETRON HCL 4 MG/2ML IJ SOLN
4.0000 mg | Freq: Four times a day (QID) | INTRAMUSCULAR | Status: DC | PRN
  Administered 2021-11-08: 4 mg via INTRAVENOUS
  Filled 2021-11-07: qty 2

## 2021-11-07 NOTE — ED Notes (Signed)
Patient transported to CT 

## 2021-11-07 NOTE — Plan of Care (Signed)
  Problem: Education: Goal: Knowledge of General Education information will improve Description: Including pain rating scale, medication(s)/side effects and non-pharmacologic comfort measures Outcome: Progressing   Problem: Clinical Measurements: Goal: Cardiovascular complication will be avoided Outcome: Progressing   Problem: Activity: Goal: Risk for activity intolerance will decrease Outcome: Progressing   Problem: Nutrition: Goal: Adequate nutrition will be maintained Outcome: Progressing   Problem: Coping: Goal: Level of anxiety will decrease Outcome: Progressing   

## 2021-11-07 NOTE — Progress Notes (Signed)
The patient is a 55 yr old woman who presented to [facility] ED on 11/06/2021 with complaints of intractable nausea/vomiting/diarrhea. She was also having abdominal pain left greater than right. She states that her last normal meal was 4 days ago. She at at [facility] prior to onset of the illness.   She carries a past medical history significant for DM II, Hyperlipidemia, Asthma, tobacco abuse, and restless leg syndrome.  The patient was admitted by my colleague, Dr. Clearence Ped early this morning.   Vital signs and labwork have been reviewed. I have also reviewed the patient's H&P.  I have seen and examined this patient myself.  Her enteric pathogen panel has returned positive for salmonella. She is receiving IV Cipro.  Will monitor patient's renal and electrolyte status. Advance diet as tolerated.

## 2021-11-07 NOTE — ED Notes (Signed)
Pt in bed, pt c/o pain with potassium infusion. No swelling noted, slowed down infusion, md notified, asked about oral K instead of iv

## 2021-11-07 NOTE — ED Notes (Signed)
Pt desated to 83% while sleeping. Pt states she has sleep apnea and is supposed to wear oxygen at night but is non compliant. Pt is placed on 3 liters nasal cannula.

## 2021-11-07 NOTE — Assessment & Plan Note (Signed)
-   Continue PPI -Pepcid given in ED -Recurrent burning symptoms

## 2021-11-07 NOTE — Assessment & Plan Note (Signed)
-   Holding Amaryl and metformin -Last hemoglobin A1c 6.8 -Update hemoglobin A1c -Full liquid diet -Sliding scale coverage -Monitor CBGs

## 2021-11-07 NOTE — Assessment & Plan Note (Signed)
-   Colitis on CT -No leukocytosis -Negative seated -Started on Zosyn in the ED -Continue Cipro Flagyl -Continue Zofran for nausea, Imodium for diarrhea, pain scale for abdominal pain -Continue to monitor

## 2021-11-07 NOTE — ED Notes (Signed)
Pt had soiled brief with stool. Pt is helped up to bathroom and clean up. Linens are changed and pt is changed into clean gown and brief. No other needs at this time.

## 2021-11-07 NOTE — Assessment & Plan Note (Signed)
-   Potassium 3.4 -Likely secondary to GI losses -Replace -Recheck in a.m. with mag

## 2021-11-07 NOTE — H&P (Signed)
History and Physical    Patient: Martha Hill CBJ:[id number] DOB: 01-17-1966 DOA: 11/06/2021 DOS: the patient was seen and examined on 11/07/2021 PCP: Rusty Aus, MD  Patient coming from: Home  Chief Complaint:  Chief Complaint  Patient presents with   Abdominal Pain   HPI: Martha Hill is a 56 y.o. female with medical history significant of with history of diabetes mellitus type 2, GERD, hyperlipidemia, hypertension, personal history of colonic polyps, sleep apnea, and more presents to ED with a chief complaint of abdominal pain.  Patient reports that she had nausea, vomiting, diarrhea and abdominal pain that started 3 days ago.  It all started with the nausea.  She reports throwing up at least 10 times per day nonbloody emesis.  She reports diarrhea that is been constant throughout the day, with to many episodes to count.  The diarrhea is green, no mucus, no blood.  She said no recent antibiotics at home.  She thought she might have food poisoning because she did eat out at [employer] the day before.  Patient reports she is also had abdominal pain.  It is diffuse but worse on the left than the right.  The pain feels like a pressure like it might burst.  When the pain starts she has copious amounts of diarrhea that she cannot stop.  Having a BM does improve the abdominal pain.  Patient reports she said fever and chills as well.  Her last normal meal was 4 days ago.  She was constipated before that and cannot remember when her last normal bowel movement once.  Patient is not on any opiate medication at home.  Patient has no other complaints at this time.  Patient is wearing oxygen at the time of my exam, she reports she has sleep apnea and she supposed to be on CPAP at home but cannot tolerate it.  She reports that put her on oxygen just because her oxygen sats while she was sleeping.  Patient is a current smoker a pack per day, she does not drink she does not use illicit drugs, she is not  vaccinated for COVID, patient is full code. Review of Systems: As mentioned in the history of present illness. All other systems reviewed and are negative. Past Medical History:  Diagnosis Date   Arthritis    DDD (degenerative disc disease), lumbar    Diabetes mellitus without complication ([place])    Family history of adverse reaction to anesthesia    children have nausea with anesthesia   Family history of breast cancer    Family history of colon cancer    Family history of colonic polyps    Family history of lung cancer    GERD (gastroesophageal reflux disease)    Gout    Hamartomatous polyp of large intestine ([place])    History of kidney cancer    HLD (hyperlipidemia)    Hypertension    Personal history of colonic polyps    PONV (postoperative nausea and vomiting)    Renal cancer ([place]) 2008   RLS (restless legs syndrome)    Sleep apnea    Past Surgical History:  Procedure Laterality Date   ABDOMINAL HYSTERECTOMY     bladder tack     CHOLECYSTECTOMY     COLONOSCOPY WITH PROPOFOL N/A 01/15/2018   Procedure: COLONOSCOPY WITH PROPOFOL;  Surgeon: Lollie Sails, MD;  Location: [facility] ENDOSCOPY;  Service: Endoscopy;  Laterality: N/A;   COLONOSCOPY WITH PROPOFOL N/A 02/11/2018   Procedure: COLONOSCOPY  WITH PROPOFOL;  Surgeon: Lollie Sails, MD;  Location: [facility] ENDOSCOPY;  Service: Endoscopy;  Laterality: N/A;   COLONOSCOPY WITH PROPOFOL N/A 09/10/2020   Procedure: COLONOSCOPY WITH PROPOFOL;  Surgeon: Lesly Rubenstein, MD;  Location: [facility] ENDOSCOPY;  Service: Endoscopy;  Laterality: N/A;   mastoid surgery for mastoiditis     ROBOTIC ASSITED PARTIAL NEPHRECTOMY Left 2008   TONSILLECTOMY     TUBAL LIGATION     Social History:  reports that she has been smoking cigarettes. She has a 38.00 pack-year smoking history. She has never used smokeless tobacco. She reports that she does not drink alcohol and does not use drugs.  Allergies  Allergen Reactions   Eucalyptus Flavor  [Flavoring Agent] Shortness Of Breath   Macrolides And Ketolides Nausea And Vomiting    Azithromycin/erythromycin   Salmon Oil [Nutritional Supplements] Swelling   Sulfa Antibiotics Other (See Comments)    Contact dermatitis due to intravaginal preparation   Eucalyptus Oil    Glipizide Nausea And Vomiting   Metformin And Related Diarrhea   Tramadol Hcl    Zithromax [Azithromycin]     Family History  Problem Relation Age of Onset   Breast cancer Maternal Aunt        dx late 24s   Breast cancer Maternal Grandmother 66   Lung cancer Paternal Uncle    Colon cancer Brother    Colon polyps Brother        200+    Prior to Admission medications   Medication Sig Start Date End Date Taking? Authorizing Provider  albuterol (PROVENTIL HFA;VENTOLIN HFA) 108 (90 Base) MCG/ACT inhaler Inhale into the lungs every 6 (six) hours as needed for wheezing or shortness of breath.    [provider]  atorvastatin (LIPITOR) 40 MG tablet Take 1 tablet (40 mg total) by mouth daily. Patient not taking: Reported on 09/09/2020 11/22/19   Sharen Hones, MD  Cholecalciferol 50 MCG (2000 UT) CAPS Take 2,000 Units by mouth daily at 6 (six) AM.    [provider]  cyanocobalamin 2000 MCG tablet Take 1 tablet (2,000 mcg total) by mouth daily. 03/25/19   Henreitta Leber, MD  Fluticasone-Salmeterol (ADVAIR) 250-50 MCG/DOSE AEPB Inhale 1 puff into the lungs 2 (two) times daily.    [provider]  furosemide (LASIX) 20 MG tablet Take 20 mg by mouth daily. 11/20/18   [provider]  gabapentin (NEURONTIN) 300 MG capsule Take 300 mg by mouth 3 (three) times daily.    [provider]  glimepiride (AMARYL) 2 MG tablet Take 2 mg by mouth daily. 03/10/19   [provider]  lisinopril (ZESTRIL) 20 MG tablet Take 20 mg by mouth daily. 08/18/19   [provider]  Magnesium 250 MG TABS Take 250 mg by mouth daily at 6 (six) AM.    [provider]  meloxicam  (MOBIC) 7.5 MG tablet Take 7.5 mg by mouth daily. Patient not taking: Reported on 09/10/2020    [provider]  metFORMIN (GLUCOPHAGE) 500 MG tablet TAKE ONE TABLET BY MOUTH TWICE DAILY BEFORE MEAL(S) Patient not taking: Reported on 09/10/2020 02/12/17   [provider]  nicotine (NICODERM CQ - DOSED IN MG/24 HOURS) 21 mg/24hr patch Place 1 patch (21 mg total) onto the skin daily. 11/22/19   Sharen Hones, MD  omeprazole (PRILOSEC) 20 MG capsule Take 1 capsule (20 mg total) by mouth 2 (two) times daily. 03/25/19   Henreitta Leber, MD  phenazopyridine (PYRIDIUM) 100 MG tablet Take  1 tablet (100 mg total) by mouth 3 (three) times daily with meals. 03/25/19   Henreitta Leber, MD  potassium chloride (K-DUR) 10 MEQ tablet TAKE ONE TABLET BY MOUTH THREE TIMES DAILY 08/23/16   [provider]  pramipexole (MIRAPEX) 0.25 MG tablet Take 0.25 mg by mouth at bedtime. 12/18/18   [provider]    Physical Exam: Vitals:   11/07/21 0239 11/07/21 0435 11/07/21 0524 11/07/21 0529  BP: 115/70 107/61    Pulse: 91 89 91   Resp: 14 16    Temp:      TempSrc:      SpO2: 95% 93% (!) 83% 99%  Weight:      Height:       1.  General: Patient lying supine in bed,  no acute distress   2. Psychiatric: Alert and oriented x 3, mood and behavior normal for situation, pleasant and cooperative with exam   3. Neurologic: Speech and language are normal, face is symmetric, moves all 4 extremities voluntarily, at baseline without acute deficits on limited exam   4. HEENMT:  Head is atraumatic, normocephalic, pupils reactive to light, neck is supple, trachea is midline, mucous membranes are moist   5. Respiratory : Lungs are clear to auscultation bilaterally without wheezing, rhonchi, rales, no cyanosis, no increase in work of breathing or accessory muscle use   6. Cardiovascular : Heart rate normal, rhythm is regular, no murmurs, rubs or gallops, peripheral pulses palpated   7.  Gastrointestinal:  Abdomen is soft, mildly distended with tenderness to palpation diffusely, no guarding bowel sounds active, no masses or organomegaly palpated   8. Skin:  Skin is warm, dry and intact without rashes, acute lesions, or ulcers on limited exam   9.Musculoskeletal:  No acute deformities or trauma, no asymmetry in tone, left leg edematous compared to right which is chronic per patient, peripheral pulses palpated, no tenderness to palpation in the extremities  Data Reviewed: In the ED Temp 98, heart rate 86-94, respiratory rate 14-16, blood pressure 107/61, satting at 93% No leukocytosis White blood cell count of 8.3, hemoglobin 8, platelets 195 Chemistry reveals a hyponatremia 132, hypokalemia 3.4, mildly elevated creatinine 1.1  Negative seated CT abdomen pelvis shows diffuse fluid in the colon consistent with history of diarrhea.  Mild diffuse wall thickening suspicious for pancolitis of infectious or inflammatory etiology Pepcid, fentanyl, 2 L bolus, Reglan, Zofran, Zosyn started in the ED Admission requested for further management of colitis Assessment and Plan: * Colitis - Colitis on CT -No leukocytosis -Negative seated -Started on Zosyn in the ED -Continue Cipro Flagyl -Continue Zofran for nausea, Imodium for diarrhea, pain scale for abdominal pain -Continue to monitor  Hypokalemia - Potassium 3.4 -Likely secondary to GI losses -Replace -Recheck in a.m. with mag  Diabetes mellitus without complication (HCC) - Holding Amaryl and metformin -Last hemoglobin A1c 6.8 -Update hemoglobin A1c -Full liquid diet -Sliding scale coverage -Monitor CBGs  Tobacco abuse - Smokes a pack a day -Declines nicotine patch for cravings at this time -Counseled on importance of cessation  GERD (gastroesophageal reflux disease) - Continue PPI -Pepcid given in ED -Recurrent burning symptoms      Advance Care Planning:   Code Status: Prior full  Consults:  None  Family Communication: No family at bedside  Severity of Illness: The appropriate patient status for this patient is INPATIENT. Inpatient status is judged to be reasonable and necessary in order to provide the required intensity of service to ensure the  patient's safety. The patient's presenting symptoms, physical exam findings, and initial radiographic and laboratory data in the context of their chronic comorbidities is felt to place them at high risk for further clinical deterioration. Furthermore, it is not anticipated that the patient will be medically stable for discharge from the hospital within 2 midnights of admission.   * I certify that at the point of admission it is my clinical judgment that the patient will require inpatient hospital care spanning beyond 2 midnights from the point of admission due to high intensity of service, high risk for further deterioration and high frequency of surveillance required.*  Author: Rolla Plate, DO 11/07/2021 6:08 AM  For on call review [email or web address].

## 2021-11-07 NOTE — ED Notes (Addendum)
Pt had soiled bed with stool. Linens are changed and pt is changed into clean brief.

## 2021-11-07 NOTE — Assessment & Plan Note (Signed)
-   Smokes a pack a day -Declines nicotine patch for cravings at this time -Counseled on importance of cessation

## 2021-11-08 LAB — COMPREHENSIVE METABOLIC PANEL
ALT: 16 U/L (ref 0–44)
AST: 23 U/L (ref 15–41)
Albumin: 3 g/dL — ABNORMAL LOW (ref 3.5–5.0)
Alkaline Phosphatase: 35 U/L — ABNORMAL LOW (ref 38–126)
Anion gap: 5 (ref 5–15)
BUN: 15 mg/dL (ref 6–20)
CO2: 27 mmol/L (ref 22–32)
Calcium: 8.2 mg/dL — ABNORMAL LOW (ref 8.9–10.3)
Chloride: 100 mmol/L (ref 98–111)
Creatinine, Ser: 0.94 mg/dL (ref 0.44–1.00)
GFR, Estimated: >60 mL/min (ref >60)
Glucose, Bld: 96 mg/dL (ref 70–99)
Potassium: 3.2 mmol/L — ABNORMAL LOW (ref 3.5–5.1)
Sodium: 132 mmol/L — ABNORMAL LOW (ref 135–145)
Total Bilirubin: 0.7 mg/dL (ref 0.3–1.2)
Total Protein: 6.4 g/dL — ABNORMAL LOW (ref 6.5–8.1)

## 2021-11-08 LAB — CBC WITH DIFFERENTIAL/PLATELET
Abs Immature Granulocytes: 0.04 10*3/uL (ref 0.00–0.07)
Basophils Absolute: 0.1 10*3/uL (ref 0.0–0.1)
Basophils Relative: 1 %
Eosinophils Absolute: 0.1 10*3/uL (ref 0.0–0.5)
Eosinophils Relative: 1 %
HCT: 40.6 % (ref 36.0–46.0)
Hemoglobin: 13 g/dL (ref 12.0–15.0)
Immature Granulocytes: 1 %
Lymphocytes Relative: 16 %
Lymphs Abs: 1.2 10*3/uL (ref 0.7–4.0)
MCH: 25.8 pg — ABNORMAL LOW (ref 26.0–34.0)
MCHC: 32 g/dL (ref 30.0–36.0)
MCV: 80.6 fL (ref 80.0–100.0)
Monocytes Absolute: 1.6 10*3/uL — ABNORMAL HIGH (ref 0.1–1.0)
Monocytes Relative: 21 %
Neutro Abs: 4.7 10*3/uL (ref 1.7–7.7)
Neutrophils Relative %: 60 %
Platelets: 162 10*3/uL (ref 150–400)
RBC: 5.04 MIL/uL (ref 3.87–5.11)
RDW: 15.3 % (ref 11.5–15.5)
WBC: 7.6 10*3/uL (ref 4.0–10.5)
nRBC: 0 % (ref 0.0–0.2)

## 2021-11-08 LAB — MAGNESIUM: Magnesium: 1.9 mg/dL (ref 1.7–2.4)

## 2021-11-08 LAB — GLUCOSE, CAPILLARY
Glucose-Capillary: 103 mg/dL — ABNORMAL HIGH (ref 70–99)
Glucose-Capillary: 127 mg/dL — ABNORMAL HIGH (ref 70–99)
Glucose-Capillary: 142 mg/dL — ABNORMAL HIGH (ref 70–99)
Glucose-Capillary: 122 mg/dL — ABNORMAL HIGH (ref 70–99)

## 2021-11-08 MED ORDER — POTASSIUM CHLORIDE CRYS ER 20 MEQ PO TBCR
40.0000 meq | EXTENDED_RELEASE_TABLET | ORAL | Status: AC
  Administered 2021-11-08 (×2): 40 meq via ORAL
  Filled 2021-11-08 (×2): qty 2

## 2021-11-08 MED ORDER — SODIUM CHLORIDE 0.9 % IV BOLUS
500.0000 mL | Freq: Once | INTRAVENOUS | Status: AC
  Administered 2021-11-08: 500 mL via INTRAVENOUS

## 2021-11-08 MED ORDER — PRAMIPEXOLE DIHYDROCHLORIDE 0.25 MG PO TABS
0.2500 mg | ORAL_TABLET | Freq: Two times a day (BID) | ORAL | Status: DC
  Administered 2021-11-08 – 2021-11-09 (×3): 0.25 mg via ORAL
  Filled 2021-11-08 (×3): qty 1

## 2021-11-08 NOTE — Progress Notes (Signed)
   11/08/21 1530  Assess: MEWS Score  Temp 98.4 F (36.9 C)  BP (!) 90/51  MAP (mmHg) (!) 63  Pulse Rate (!) 104  Resp 18  SpO2 95 %  Assess: MEWS Score  MEWS Temp 0  MEWS Systolic 1  MEWS Pulse 1  MEWS RR 0  MEWS LOC 0  MEWS Score 2  MEWS Score Color Yellow  Assess: if the MEWS score is Yellow or Red  Were vital signs taken at a resting state? Yes  Focused Assessment No change from prior assessment  Does the patient meet 2 or more of the SIRS criteria? No  MEWS guidelines implemented *See Row Information* Yes  Treat  MEWS Interventions Administered scheduled meds/treatments  Take Vital Signs  Increase Vital Sign Frequency  Yellow: Q 2hr X 2 then Q 4hr X 2, if remains yellow, continue Q 4hrs  Escalate  MEWS: Escalate Yellow: discuss with charge nurse/RN and consider discussing with provider and RRT  Notify: Charge Nurse/RN  Name of Charge Nurse/RN Notified Company secretary RN  Date Charge Nurse/RN Notified 11/08/21  Time Charge Nurse/RN Notified 1600  Notify: Provider  Provider Name/Title Dr. Benny Lennert  Date Provider Notified 11/08/21  Time Provider Notified 1600  Method of Notification Page  Notification Reason Other (Comment) (yellow Mews)  Provider response See new orders  Date of Provider Response 11/08/21  Time of Provider Response 1610  Document  Patient Outcome Stabilized after interventions  Progress note created (see row info) Yes  Assess: SIRS CRITERIA  SIRS Temperature  0  SIRS Pulse 1  SIRS Respirations  0  SIRS WBC 0  SIRS Score Sum  1

## 2021-11-08 NOTE — Progress Notes (Signed)
PROGRESS NOTE  Martha MOEHLE LPN:[id number] DOB: 1965-08-03 DOA: 11/06/2021 PCP: Rusty Aus, MD  Brief History   The patient is a 56 yr old woman who presented to [facility] ED on 11/06/2021 with complaints of intractable nausea/vomiting/diarrhea. She was also having abdominal pain left greater than right. She states that her last normal meal was 4 days ago. She at at [facility] prior to onset of the illness.    She carries a past medical history significant for DM II, Hyperlipidemia, Asthma, tobacco abuse, and restless leg syndrome.   The patient was admitted by my colleague, Dr. Clearence Ped early 11/07/2021.    Her enteric pathogen panel has returned positive for salmonella. She is receiving IV Cipro.  The patient is feeling better today. Her diet has been advanced. She continues to have diarrhea. She has been transferred to the floor.   Blood pressures this afternoon were a little low. She was given a 500 cc bolus.  Consultants  None  Procedures  None  Antibiotics  .  Subjective  The patient is feeling better. No new complaints.  Objective   Vitals:  Vitals:   11/08/21 0726 11/08/21 1530  BP: 100/64 (!) 90/51  Pulse: 97 (!) 104  Resp: 18 18  Temp: 98.1 F (36.7 C) 98.4 F (36.9 C)  SpO2: 96% 95%    Exam:  Constitutional:  The patient is awake, alert, and oriented x 3. No acute distress. Respiratory:  No increased work of breathing. No wheezes, rales, or rhonchi No tactile fremitus Cardiovascular:  Regular rate and rhythm No murmurs, ectopy, or gallups. No lateral PMI. No thrills. Abdomen:  Abdomen is soft, non-tender, non-distended No hernias, masses, or organomegaly Normoactive bowel sounds.  Musculoskeletal:  No cyanosis, clubbing, or edema Skin:  No rashes, lesions, ulcers palpation of skin: no induration or nodules Neurologic:  CN 2-12 intact Sensation all 4 extremities intact Psychiatric:  Mental status Mood, affect appropriate Orientation  to person, place, time  judgment and insight appear intact   I have personally reviewed the following:   Today's Data  Vitals  Lab Data  CMP CBC  Micro Data  Enteric pathogen panel is positive for salmonella.   Imaging  CT abdomen and pelvis  Cardiology Data    Other Data    Scheduled Meds:  aspirin EC  81 mg Oral Daily   heparin  5,000 Units Subcutaneous Q8H   insulin aspart  0-15 Units Subcutaneous TID WC   insulin aspart  0-5 Units Subcutaneous QHS   lisinopril  10 mg Oral Daily   pantoprazole  40 mg Oral Daily   pramipexole  0.25 mg Oral BID   Continuous Infusions:  sodium chloride 100 mL/hr at 11/08/21 1609   ciprofloxacin 400 mg (11/08/21 1453)   metronidazole 500 mg (11/08/21 1153)    Principal Problem:   Colitis Active Problems:   GERD (gastroesophageal reflux disease)   Tobacco abuse   Diabetes mellitus without complication ([place])   Hypokalemia   LOS: 1 day   A & P  Colitis due to salmonella - Colitis on CT, stool positive for salmonella. -No leukocytosis -Negative C Diff. -Continue Cipro  -Continue Zofran for nausea, Imodium for diarrhea, pain scale for abdominal pain -Continue to monitor -Diet advanced   Hypokalemia - Potassium 3.2 - supplemented. Continue to monitor.  -Likely secondary to GI losses -Magnesium is 1.9 this am.   Diabetes mellitus without complication (HCC) - Holding Amaryl and metformin -Last hemoglobin A1c 6.8 -Update hemoglobin A1c -  Full liquid diet -Sliding scale coverage -Glucoses have been between 96 and 131 in the last 24 hoyurs.   Tobacco abuse - Smokes a pack a day -Declines nicotine patch for cravings at this time -Counseled on importance of cessation   GERD (gastroesophageal reflux disease) - Continue PPI -Pepcid given in ED -Recurrent burning symptoms   I have seen and examined this patient myself. I have spent 34 minutes in her evaluation and care.  DVT prophylaxis: Heparin Code Status: full  code Family Communication: None available Disposition Plan: Likely home tomorrow.    Purnell Daigle, DO [facility] Direct contact: see [email or web address]  7PM-7AM contact night coverage as above 11/08/2021, 4:50 PM  LOS: 1 day

## 2021-11-09 DIAGNOSIS — K529 Noninfective gastroenteritis and colitis, unspecified: Secondary | ICD-10-CM

## 2021-11-09 LAB — GLUCOSE, CAPILLARY
Glucose-Capillary: 97 mg/dL (ref 70–99)
Glucose-Capillary: 95 mg/dL (ref 70–99)

## 2021-11-09 LAB — CBC WITH DIFFERENTIAL/PLATELET
Abs Immature Granulocytes: 0.04 10*3/uL (ref 0.00–0.07)
Basophils Absolute: 0 10*3/uL (ref 0.0–0.1)
Basophils Relative: 1 %
Eosinophils Absolute: 0.1 10*3/uL (ref 0.0–0.5)
Eosinophils Relative: 1 %
HCT: 35.6 % — ABNORMAL LOW (ref 36.0–46.0)
Hemoglobin: 11.6 g/dL — ABNORMAL LOW (ref 12.0–15.0)
Immature Granulocytes: 1 %
Lymphocytes Relative: 18 %
Lymphs Abs: 1 10*3/uL (ref 0.7–4.0)
MCH: 26.1 pg (ref 26.0–34.0)
MCHC: 32.6 g/dL (ref 30.0–36.0)
MCV: 80 fL (ref 80.0–100.0)
Monocytes Absolute: 1.1 10*3/uL — ABNORMAL HIGH (ref 0.1–1.0)
Monocytes Relative: 20 %
Neutro Abs: 3.2 10*3/uL (ref 1.7–7.7)
Neutrophils Relative %: 59 %
Platelets: 116 10*3/uL — ABNORMAL LOW (ref 150–400)
RBC: 4.45 MIL/uL (ref 3.87–5.11)
RDW: 15.2 % (ref 11.5–15.5)
Smear Review: NORMAL
WBC Morphology: INCREASED
WBC: 5.3 10*3/uL (ref 4.0–10.5)
nRBC: 0 % (ref 0.0–0.2)

## 2021-11-09 LAB — BASIC METABOLIC PANEL
Anion gap: 4 — ABNORMAL LOW (ref 5–15)
BUN: 7 mg/dL (ref 6–20)
CO2: 24 mmol/L (ref 22–32)
Calcium: 8.1 mg/dL — ABNORMAL LOW (ref 8.9–10.3)
Chloride: 107 mmol/L (ref 98–111)
Creatinine, Ser: 0.75 mg/dL (ref 0.44–1.00)
GFR, Estimated: >60 mL/min (ref >60)
Glucose, Bld: 97 mg/dL (ref 70–99)
Potassium: 3 mmol/L — ABNORMAL LOW (ref 3.5–5.1)
Sodium: 135 mmol/L (ref 135–145)

## 2021-11-09 LAB — HIV ANTIBODY (ROUTINE TESTING W REFLEX): HIV Screen 4th Generation wRfx: NONREACTIVE

## 2021-11-09 MED ORDER — FUROSEMIDE 20 MG PO TABS: ORAL_TABLET | ORAL | Status: AC

## 2021-11-09 MED ORDER — POTASSIUM CHLORIDE CRYS ER 20 MEQ PO TBCR
40.0000 meq | EXTENDED_RELEASE_TABLET | ORAL | Status: DC
  Administered 2021-11-09: 40 meq via ORAL
  Filled 2021-11-09: qty 2

## 2021-11-09 MED ORDER — CIPROFLOXACIN HCL 500 MG PO TABS
500.0000 mg | ORAL_TABLET | Freq: Once | ORAL | Status: AC
  Administered 2021-11-09: 500 mg via ORAL
  Filled 2021-11-09: qty 1

## 2021-11-09 MED ORDER — POTASSIUM CHLORIDE ER 10 MEQ PO TBCR: 30.0000 meq | EXTENDED_RELEASE_TABLET | Freq: Every day | ORAL | Status: AC

## 2021-11-09 MED ORDER — LOPERAMIDE HCL 2 MG PO CAPS: 2.0000 mg | ORAL_CAPSULE | ORAL | 0 refills | Status: DC | PRN

## 2021-11-09 MED ORDER — LISINOPRIL 20 MG PO TABS: ORAL_TABLET | ORAL | Status: AC

## 2021-11-09 NOTE — Discharge Summary (Signed)
Physician Discharge Summary   Martha Hill  female DOB: 11/08/1965  TGG:[id number]  PCP: Rusty Aus, MD  Admit date: 11/06/2021 Discharge date: 11/09/2021  Admitted From: home Disposition:  home CODE STATUS: Full code   Hospital Course:  For full details, please see H&P, progress notes, consult notes and ancillary notes.  Briefly,  The patient is a 56 yr old woman with hx of DM II, Asthma, tobacco abuse who presented to [facility] ED with complaints of intractable nausea/vomiting/diarrhea. She was also having abdominal pain left greater than right.  She states that her last normal meal was 4 days ago. She at at [facility] prior to onset of the illness.   Colitis due to salmonella - Colitis on CT, stool positive for salmonella. -No leukocytosis -Negative C Diff. --Pt finished 3 days of abx with zosyn and cipro/flagyl. --Imodium PRN   Acute on chronic Hypokalemia --at baseline, pt take 30 mEq potassium daily.  Hypokalemia exacerbated by GI loss. --extra potassium supplementation during hospitalization.  Discharged on home potassium supplement.   Diabetes mellitus without complication ([place]), well controlled --A1c 5.8 - pt no longer taking Amaryl and metformin.   Tobacco abuse - Smokes a pack a day -Declines nicotine patch for cravings at this time -Counseled on importance of cessation   GERD (gastroesophageal reflux disease) - Continue PPI  Hx of HTN --BP soft during hospitalization.   --home lasix and Lisinopril held pending PCP f/u.  Unless noted above, medications listed under "STOP" are ones pt wasn't taking PTA.   Discharge Diagnoses:  Principal Problem:   Colitis Active Problems:   GERD (gastroesophageal reflux disease)   Tobacco abuse   Diabetes mellitus without complication (HCC)   Hypokalemia   30 Day Unplanned Readmission Risk Score    Flowsheet Row ED to Hosp-Admission (Current) from 11/06/2021 in [place]   30 Day Unplanned Readmission Risk Score (%) 9.5 Filed at 11/09/2021 1200       This score is the patient's risk of an unplanned readmission within 30 days of being discharged (0 -100%). The score is based on dignosis, age, lab data, medications, orders, and past utilization.   Low:  0-14.9   Medium: 15-21.9   High: 22-29.9   Extreme: 30 and above         Discharge Instructions:  Allergies as of 11/09/2021       Reactions   Eucalyptus Flavor [flavoring Agent] Shortness Of Breath   Macrolides And Ketolides Nausea And Vomiting   Azithromycin/erythromycin   Salmon Oil [nutritional Supplements] Swelling   Sulfa Antibiotics Other (See Comments)   Contact dermatitis due to intravaginal preparation   Eucalyptus Oil    Glipizide Nausea And Vomiting   Metformin And Related Diarrhea   Tramadol Hcl    Zithromax [azithromycin]         Medication List     STOP taking these medications    atorvastatin 40 MG tablet Commonly known as: LIPITOR   cyanocobalamin 2000 MCG tablet   gabapentin 300 MG capsule Commonly known as: NEURONTIN   glimepiride 2 MG tablet Commonly known as: AMARYL   meloxicam 7.5 MG tablet Commonly known as: MOBIC   metFORMIN 500 MG tablet Commonly known as: GLUCOPHAGE   nicotine 21 mg/24hr patch Commonly known as: NICODERM CQ - dosed in mg/24 hours   phenazopyridine 100 MG tablet Commonly known as: PYRIDIUM       TAKE these medications    albuterol 108 (90 Base)  MCG/ACT inhaler Commonly known as: VENTOLIN HFA Inhale into the lungs every 6 (six) hours as needed for wheezing or shortness of breath.   aspirin EC 81 MG tablet Take 81 mg by mouth daily. Swallow whole.   Cholecalciferol 50 MCG (2000 UT) Caps Take 2,000 Units by mouth daily at 6 (six) AM.   Fluticasone-Salmeterol 250-50 MCG/DOSE Aepb Commonly known as: ADVAIR Inhale 1 puff into the lungs 2 (two) times daily.   furosemide 20 MG tablet Commonly known as: LASIX Hold until  followup with PCP due to low blood pressure and diarrhea. What changed:  how much to take how to take this when to take this additional instructions   lisinopril 20 MG tablet Commonly known as: ZESTRIL Hold until followup with PCP due to low blood pressure and diarrhea. What changed:  how much to take how to take this when to take this additional instructions   loperamide 2 MG capsule Commonly known as: IMODIUM Take 1 capsule (2 mg total) by mouth as needed for diarrhea or loose stools.   Magnesium 250 MG Tabs Take 250 mg by mouth daily at 6 (six) AM.   omeprazole 20 MG capsule Commonly known as: PRILOSEC Take 1 capsule (20 mg total) by mouth 2 (two) times daily.   potassium chloride 10 MEQ tablet Commonly known as: KLOR-CON Take 3 tablets (30 mEq total) by mouth daily. Home med. What changed:  how much to take when to take this additional instructions   pramipexole 0.25 MG tablet Commonly known as: MIRAPEX Take 0.25 mg by mouth at bedtime.   Trulicity 5.63 SL/3.7DS Sopn Generic drug: Dulaglutide Inject 0.5 mLs into the skin once a week. On Sundays         Follow-up Information     Rusty Aus, MD Follow up in 1 week(s).   Specialty: Internal Medicine Contact information: [place] [address] [phone]                 Allergies  Allergen Reactions   Eucalyptus Flavor [Flavoring Agent] Shortness Of Breath   Macrolides And Ketolides Nausea And Vomiting    Azithromycin/erythromycin   Salmon Oil [Nutritional Supplements] Swelling   Sulfa Antibiotics Other (See Comments)    Contact dermatitis due to intravaginal preparation   Eucalyptus Oil    Glipizide Nausea And Vomiting   Metformin And Related Diarrhea   Tramadol Hcl    Zithromax [Azithromycin]      The results of significant diagnostics from this hospitalization (including imaging, microbiology, ancillary and laboratory) are listed below for reference.    Consultations:   Procedures/Studies: CT ABDOMEN PELVIS W CONTRAST  Result Date: 11/07/2021 CLINICAL DATA:  Vomiting diarrhea lower abdominal pain EXAM: CT ABDOMEN AND PELVIS WITH CONTRAST TECHNIQUE: Multidetector CT imaging of the abdomen and pelvis was performed using the standard protocol following bolus administration of intravenous contrast. RADIATION DOSE REDUCTION: This exam was performed according to the departmental dose-optimization program which includes automated exposure control, adjustment of the mA and/or kV according to patient size and/or use of iterative reconstruction technique. CONTRAST:  139m OMNIPAQUE IOHEXOL 300 MG/ML  SOLN COMPARISON:  CT 01/23/2019 FINDINGS: Lower chest: No acute abnormality. Hepatobiliary: Nodular hepatic contour, suspicious for cirrhosis. Status post cholecystectomy. Prominent extrahepatic common bile duct likely due to postsurgical change. Pancreas: Unremarkable. No pancreatic ductal dilatation or surrounding inflammatory changes. Spleen: Normal in size without focal abnormality. Adrenals/Urinary Tract: Adrenal glands are within normal limits. Kidneys show no hydronephrosis. Cyst lower pole left  kidney, no follow-up imaging is recommended. The bladder is unremarkable Stomach/Bowel: The stomach is nonenlarged. No dilated small bowel. Fluid-filled colon with mild diffuse wall thickening, suspicious for a colitis. Appendix within normal limits Vascular/Lymphatic: Moderate aortic atherosclerosis. No aneurysm. No suspicious lymph nodes Reproductive: Status post hysterectomy. No adnexal masses. Other: Negative for pelvic effusion or free air Musculoskeletal: No acute osseous abnormality IMPRESSION: 1. Diffuse fluid in the colon consistent with history of diarrhea. Mild diffuse wall thickening is suspicious for pancolitis of infectious or inflammatory etiology. 2. Liver cirrhosis Electronically Signed   By: Donavan Foil M.D.   On: 11/07/2021 02:49      Labs: BNP  (last 3 results) No results for input(s): BNP in the last 8760 hours. Basic Metabolic Panel: Recent Labs  Lab 11/06/21 2259 11/08/21 0152 11/09/21 0546  NA 132* 132* 135  K 3.4* 3.2* 3.0*  CL 95* 100 107  CO2 '27 27 24  '$ GLUCOSE 131* 96 97  BUN 26* 15 7  CREATININE 1.19* 0.94 0.75  CALCIUM 9.3 8.2* 8.1*  MG  --  1.9  --    Liver Function Tests: Recent Labs  Lab 11/06/21 2259 11/08/21 0152  AST 22 23  ALT 16 16  ALKPHOS 38 35*  BILITOT 0.8 0.7  PROT 8.2* 6.4*  ALBUMIN 3.7 3.0*   Recent Labs  Lab 11/06/21 2259  LIPASE 28   No results for input(s): AMMONIA in the last 168 hours. CBC: Recent Labs  Lab 11/06/21 2259 11/08/21 0152 11/09/21 0546  WBC 8.3 7.6 5.3  NEUTROABS  --  4.7 3.2  HGB 15.3* 13.0 11.6*  HCT 46.4* 40.6 35.6*  MCV 79.0* 80.6 80.0  PLT 195 162 116*   Cardiac Enzymes: No results for input(s): CKTOTAL, CKMB, CKMBINDEX, TROPONINI in the last 168 hours. BNP: Invalid input(s): POCBNP CBG: Recent Labs  Lab 11/08/21 1124 11/08/21 1605 11/08/21 2012 11/09/21 0742 11/09/21 1144  GLUCAP 127* 142* 122* 97 95   D-Dimer No results for input(s): DDIMER in the last 72 hours. Hgb A1c Recent Labs    11/06/21 2259  HGBA1C 5.8*   Lipid Profile No results for input(s): CHOL, HDL, LDLCALC, TRIG, CHOLHDL, LDLDIRECT in the last 72 hours. Thyroid function studies No results for input(s): TSH, T4TOTAL, T3FREE, THYROIDAB in the last 72 hours.  Invalid input(s): FREET3 Anemia work up No results for input(s): VITAMINB12, FOLATE, FERRITIN, TIBC, IRON, RETICCTPCT in the last 72 hours. Urinalysis    Component Value Date/Time   COLORURINE AMBER (A) 03/23/2019 1018   APPEARANCEUR CLOUDY (A) 03/23/2019 1018   APPEARANCEUR Clear 09/25/2017 1103   LABSPEC 1.010 03/23/2019 1018   LABSPEC 1.009 06/10/2014 1237   PHURINE 7.0 03/23/2019 1018   GLUCOSEU 50 (A) 03/23/2019 1018   GLUCOSEU Negative 06/10/2014 1237   HGBUR MODERATE (A) 03/23/2019 1018    BILIRUBINUR NEGATIVE 03/23/2019 1018   BILIRUBINUR Negative 09/25/2017 1103   BILIRUBINUR Negative 06/10/2014 1237   KETONESUR NEGATIVE 03/23/2019 1018   PROTEINUR 100 (A) 03/23/2019 1018   NITRITE POSITIVE (A) 03/23/2019 1018   LEUKOCYTESUR LARGE (A) 03/23/2019 1018   LEUKOCYTESUR Negative 06/10/2014 1237   Sepsis Labs Invalid input(s): PROCALCITONIN,  WBC,  LACTICIDVEN Microbiology Recent Results (from the past 240 hour(s))  C Difficile Quick Screen w PCR reflex     Status: None   Collection Time: 11/07/21  2:13 AM   Specimen: STOOL  Result Value Ref Range Status   C Diff antigen NEGATIVE NEGATIVE Final   C Diff toxin NEGATIVE NEGATIVE  Final   C Diff interpretation No C. difficile detected.  Final    Comment: Performed at [facility], [address] [place], [place] [address]  Gastrointestinal Panel by PCR , Stool     Status: Abnormal   Collection Time: 11/07/21  2:13 AM   Specimen: Stool  Result Value Ref Range Status   Campylobacter species NOT DETECTED NOT DETECTED Final   Plesimonas shigelloides NOT DETECTED NOT DETECTED Final   Salmonella species DETECTED (A) NOT DETECTED Final    Comment: RESULT CALLED TO, READ BACK BY AND VERIFIED WITH: Levada Schilling, RN 11/07/21 1445 MW    Yersinia enterocolitica NOT DETECTED NOT DETECTED Final   Vibrio species NOT DETECTED NOT DETECTED Final   Vibrio cholerae NOT DETECTED NOT DETECTED Final   Enteroaggregative E coli (EAEC) NOT DETECTED NOT DETECTED Final   Enteropathogenic E coli (EPEC) NOT DETECTED NOT DETECTED Final   Enterotoxigenic E coli (ETEC) NOT DETECTED NOT DETECTED Final   Shiga like toxin producing E coli (STEC) NOT DETECTED NOT DETECTED Final   Shigella/Enteroinvasive E coli (EIEC) NOT DETECTED NOT DETECTED Final   Cryptosporidium NOT DETECTED NOT DETECTED Final   Cyclospora cayetanensis NOT DETECTED NOT DETECTED Final   Entamoeba histolytica NOT DETECTED NOT DETECTED Final   Giardia lamblia NOT DETECTED  NOT DETECTED Final   Adenovirus F40/41 NOT DETECTED NOT DETECTED Final   Astrovirus NOT DETECTED NOT DETECTED Final   Norovirus GI/GII NOT DETECTED NOT DETECTED Final   Rotavirus A NOT DETECTED NOT DETECTED Final   Sapovirus (I, II, IV, and V) NOT DETECTED NOT DETECTED Final    Comment: Performed at [facility], [address] [place], [place] [address]     Total time spend on discharging this patient, including the last patient exam, discussing the hospital stay, instructions for ongoing care as it relates to all pertinent caregivers, as well as preparing the medical discharge records, prescriptions, and/or referrals as applicable, is 45 minutes.    Enzo Bi, MD  [facility] 11/09/2021, 12:32 PM

## 2021-11-09 NOTE — TOC CM/SW Note (Signed)
Patient has orders to discharge home today. Chart reviewed. PCP is Emily Filbert, MD. On room air. No wounds. No TOC needs identified. CSW signing off.  Dayton Scrape, [place]

## 2021-11-09 NOTE — Progress Notes (Signed)
Patient medically cleared with discharge order placed by MD.  Patient received AVS including education from primary RN regarding changes to medications and follow up appointments with all questions and concerns addressed by primary RN.  Patient's PIV removed prior to discharge.  Patient left the unit with transportation provided by family to home '@1415'$ .

## 2022-01-03 ENCOUNTER — Other Ambulatory Visit: Payer: Self-pay | Admitting: Internal Medicine

## 2022-01-03 DIAGNOSIS — Z1231 Encounter for screening mammogram for malignant neoplasm of breast: Secondary | ICD-10-CM

## 2022-01-27 ENCOUNTER — Ambulatory Visit
Admission: RE | Admit: 2022-01-27 | Discharge: 2022-01-27 | Disposition: A | Discharge status: Home / Self Care | Payer: Medicaid Other | Source: Ambulatory Visit | Attending: Internal Medicine | Admitting: Internal Medicine

## 2022-01-27 DIAGNOSIS — Z1231 Encounter for screening mammogram for malignant neoplasm of breast: Secondary | ICD-10-CM | POA: Insufficient documentation

## 2022-07-07 ENCOUNTER — Encounter: Payer: Self-pay | Admitting: Obstetrics and Gynecology

## 2022-07-07 ENCOUNTER — Ambulatory Visit (INDEPENDENT_AMBULATORY_CARE_PROVIDER_SITE_OTHER): Payer: Medicaid Other | Admitting: Obstetrics and Gynecology

## 2022-07-07 VITALS — BP 105/64 | HR 89 | Ht 59.45 in | Wt 211.0 lb

## 2022-07-07 DIAGNOSIS — R310 Gross hematuria: Secondary | ICD-10-CM

## 2022-07-07 DIAGNOSIS — N952 Postmenopausal atrophic vaginitis: Secondary | ICD-10-CM | POA: Diagnosis not present

## 2022-07-07 DIAGNOSIS — R350 Frequency of micturition: Secondary | ICD-10-CM | POA: Diagnosis not present

## 2022-07-07 DIAGNOSIS — K5904 Chronic idiopathic constipation: Secondary | ICD-10-CM

## 2022-07-07 DIAGNOSIS — R3915 Urgency of urination: Secondary | ICD-10-CM

## 2022-07-07 LAB — POCT URINALYSIS DIPSTICK
Bilirubin, UA: NEGATIVE
Blood, UA: NEGATIVE
Glucose, UA: NEGATIVE
Ketones, UA: NEGATIVE
Leukocytes, UA: NEGATIVE
Nitrite, UA: NEGATIVE
Protein, UA: NEGATIVE
Spec Grav, UA: 1.02 (ref 1.010–1.025)
Urobilinogen, UA: 0.2 E.U./dL
pH, UA: 7 (ref 5.0–8.0)

## 2022-07-07 MED ORDER — CIPROFLOXACIN HCL 500 MG PO TABS: ORAL_TABLET | ORAL | 0 refills | Status: AC

## 2022-07-07 MED ORDER — ESTRADIOL 0.1 MG/GM VA CREA: TOPICAL_CREAM | VAGINAL | 11 refills | Status: AC

## 2022-07-07 NOTE — Progress Notes (Signed)
[facility] New Patient Evaluation and Consultation  Referring Provider: Peggye Form, NP PCP: Rusty Aus, MD Date of Service: 07/07/2022  SUBJECTIVE Chief Complaint: New Patient (Initial Visit) Martha Hill is a 57 y.o. female here for a consult for recurrent UTI./)  History of Present Illness: Martha Hill is a 57 y.o. White or Caucasian female seen in consultation at the request of NP Lang Snow for evaluation of prolapse.    Review of records from [place] significant for: Has had a hysterectomy in 2008. Has a history of recurrent urinary tract infection. Last positive urine culture was 10/03/21 and 09/22/21 (in care everywhere)- positive for E. Coli  Last Hgb A1c was 6.4% on 02/09/22  In 2019, was seeing Urology for gross hematuria. Negative CT Urogram and cystoscopy. Urine cytology was negative. Plan was to repeat renal US due to renal cyst.   Urinary Symptoms: Leaks urine with cough/ sneeze, laughing, with movement to the bathroom, and with urgency Leaks about weekly Pad use: none, unless she has a UTI because she leaks more She is bothered by her UI symptoms. Has a history of "bladder tack" with mesh around 2004. She reports this was for bladder leakage.   Day time voids 4-5.  Nocturia: 0 times per night to void. Voiding dysfunction: she empties her bladder well.  does not use a catheter to empty bladder.  When urinating, she feels a weak stream and the need to urinate multiple times in a row Drinks: 2 cups coffee in AM, 2 bottles of coke per day, cup of milk, does not really drink water  UTIs: 2 UTI's in the last year.  She feels she had more than that but does not always go to the doctor for them. She feels burning and urgency, blood in the urine. Also will get back pain.  Reports history of blood in urine- seen blood and clots about 4-5 months ago.  Has seen a Urologist a few years ago and had several cystoscopies.  History  of kidney cancer and partial left nephrectomy. Has history of kidney stones  Pelvic Organ Prolapse Symptoms:                  She Denies a feeling of a bulge the vaginal area. Sometimes feels pressure in the vagina when she has UTIs.  She had two "bladder tacks", first was around 1994, then the next was in 2004. In the last procedure was for leakage.   Bowel Symptom: Bowel movements: 1 time(s) per week Stool consistency: hard Straining: yes, sometimes  Splinting: no.  Incomplete evacuation: yes.  Denies accidental bowel leakage / fecal incontinence Bowel regimen: stool softener Last colonoscopy: Date 2022- scheduled again this month. Was monitoring polyp  Sexual Function Sexually active: yes.  Sexual orientation: Straight Pain with sex: No  Pelvic Pain Admits to pelvic pain- suprapubic area   Past Medical History:  Past Medical History:  Diagnosis Date   Arthritis    DDD (degenerative disc disease), lumbar    Diabetes mellitus without complication ([place])    Family history of adverse reaction to anesthesia    children have nausea with anesthesia   Family history of breast cancer    Family history of colon cancer    Family history of colonic polyps    Family history of lung cancer    GERD (gastroesophageal reflux disease)    Gout    Hamartomatous polyp of large intestine ([place])  History of kidney cancer    HLD (hyperlipidemia)    Hypertension    Personal history of colonic polyps    PONV (postoperative nausea and vomiting)    Renal cancer ([place]) 2008   RLS (restless legs syndrome)    Sleep apnea      Past Surgical History:   Past Surgical History:  Procedure Laterality Date   ABDOMINAL HYSTERECTOMY     bladder tack     CHOLECYSTECTOMY     COLONOSCOPY WITH PROPOFOL N/A 01/15/2018   Procedure: COLONOSCOPY WITH PROPOFOL;  Surgeon: Lollie Sails, MD;  Location: [facility] ENDOSCOPY;  Service: Endoscopy;  Laterality: N/A;   COLONOSCOPY WITH PROPOFOL N/A 02/11/2018    Procedure: COLONOSCOPY WITH PROPOFOL;  Surgeon: Lollie Sails, MD;  Location: [facility] ENDOSCOPY;  Service: Endoscopy;  Laterality: N/A;   COLONOSCOPY WITH PROPOFOL N/A 09/10/2020   Procedure: COLONOSCOPY WITH PROPOFOL;  Surgeon: Lesly Rubenstein, MD;  Location: [facility] ENDOSCOPY;  Service: Endoscopy;  Laterality: N/A;   mastoid surgery for mastoiditis     ROBOTIC ASSITED PARTIAL NEPHRECTOMY Left 2008   TONSILLECTOMY     TUBAL LIGATION       Past OB/GYN History: OB History  Gravida Para Term Preterm AB Living  '2 2 2     2  '$ SAB IAB Ectopic Multiple Live Births          2    # Outcome Date GA Lbr Len/2nd Weight Sex Delivery Anes PTL Lv  2 Term      Vag-Spont     1 Term      Vag-Spont      S/p hysterectomy Last pap smear was 03/2022- negative.     Medications: She has a current medication list which includes the following prescription(s): albuterol, aspirin ec, cholecalciferol, ciprofloxacin, trulicity, [START ON 11/05/6946] estradiol, fluticasone-salmeterol, furosemide, lisinopril, magnesium, omeprazole, potassium chloride, and pramipexole.   Allergies: Patient is allergic to eucalyptus flavor [flavoring agent], macrolides and ketolides, salmon oil [nutritional supplements], sulfa antibiotics, eucalyptus oil, glipizide, metformin and related, tramadol hcl, and zithromax [azithromycin].   Social History:  Social History   Tobacco Use   Smoking status: Every Day    Packs/day: 1.00    Years: 38.00    Total pack years: 38.00    Types: Cigarettes   Smokeless tobacco: Never  Vaping Use   Vaping Use: Never used  Substance Use Topics   Alcohol use: No   Drug use: No    Relationship status: long-term partner   Family History:   Family History  Problem Relation Age of Onset   Hypertension Mother    Diabetes Mother    Colon cancer Brother    Colon polyps Brother        200+   Breast cancer Maternal Aunt        dx late 13s   Lung cancer Paternal Uncle    Breast cancer  Maternal Grandmother 50     Review of Systems: Review of Systems  Constitutional:  Positive for weight loss. Negative for fever and malaise/fatigue.  Respiratory:  Negative for cough, shortness of breath and wheezing.   Cardiovascular:  Positive for leg swelling. Negative for chest pain and palpitations.  Gastrointestinal:  Negative for abdominal pain and blood in stool.  Genitourinary:  Positive for dysuria.  Musculoskeletal:  Negative for myalgias.  Skin:  Negative for rash.  Neurological:  Negative for dizziness and headaches.  Endo/Heme/Allergies:  Bruises/bleeds easily.  Psychiatric/Behavioral:  Negative for depression. The patient is not  nervous/anxious.      OBJECTIVE Physical Exam: Vitals:   07/07/22 1418  BP: 105/64  Pulse: 89  Weight: 211 lb (95.7 kg)  Height: 4' 11.45" (1.51 m)    Physical Exam Constitutional:      General: She is not in acute distress. Pulmonary:     Effort: Pulmonary effort is normal.  Abdominal:     General: There is no distension.     Palpations: Abdomen is soft.     Tenderness: There is no abdominal tenderness. There is no rebound.  Musculoskeletal:        General: No swelling. Normal range of motion.  Skin:    General: Skin is warm and dry.     Findings: No rash.  Neurological:     Mental Status: She is alert and oriented to person, place, and time.  Psychiatric:        Mood and Affect: Mood normal.        Behavior: Behavior normal.      GU / Detailed Urogynecologic Evaluation:  Pelvic Exam: Normal external female genitalia; Bartholin's and Skene's glands normal in appearance; urethral meatus normal in appearance, no urethral masses or discharge.   CST: negative  s/p hysterectomy: Speculum exam reveals normal vaginal mucosa with  atrophy and normal vaginal cuff.  Adnexa no mass, fullness, tenderness.    Pelvic floor strength I/V  Pelvic floor musculature: Right levator non-tender, Right obturator non-tender, Left levator  non-tender, Left obturator non-tender  POP-Q:   POP-Q  -3                                            Aa   -3                                           Ba  -7.5                                              C   4                                            Gh  2.5                                            Pb  8                                            tvl   -3                                            Ap  -3  Bp                                                 D      Rectal Exam:  Normal external rectum  Post-Void Residual (PVR) by Bladder Scan: In order to evaluate bladder emptying, we discussed obtaining a postvoid residual and she agreed to this procedure.  Procedure: The ultrasound unit was placed on the patient's abdomen in the suprapubic region after the patient had voided. A PVR of 44 ml was obtained by bladder scan.  Laboratory Results: POC urine: negative   ASSESSMENT AND PLAN Ms. Amstutz is a 57 y.o. with:  1. Gross hematuria   2. Urinary urgency   3. Urinary frequency   4. Vaginal atrophy   5. Chronic idiopathic constipation    Gross hematuria - Has history of gross hematuria, last several months ago but has not had workup since  - has a history of renal cell carcinoma and mesh sling - recommended CT a/p with contrast to assess kidneys as well as office cystoscopy - Unclear if she is having recurrent UTI symptoms since she has only had two urine cultures in the last year. POC urine today is negative. Encouraged to provide urine samples either at our office or PCP for culture when she has episodes of hematuria or other symptoms.   2. Vaginal atrophy - start estrace cream 0.5g nightly for two weeks then twice a week after.  - Discussed to use this to also help prevent urinary tract infection.   3. Urinary urgency and frequency - discussed drinking more water and decreasing coffee and soda intake.   4.  Constipation - For constipation, we reviewed the importance of a better bowel regimen.  We also discussed the importance of avoiding chronic straining, as it can exacerbate her pelvic floor symptoms.  - We discussed initiating therapy with increasing fluid intake, fiber supplementation, stool softeners, and laxatives such as miralax. Recommended daily miralax   - No prolapse noted today   Jaquita Folds, MD   Medical Decision Making:  - Reviewed/ ordered a clinical laboratory test - Reviewed/ ordered a radiologic study - Review and summation of prior records

## 2022-07-07 NOTE — Patient Instructions (Addendum)
For urinary tract infections, Start vaginal estrogen therapy nightly (a finger tip sized amount) for two weeks then 2 times weekly at night.   If you have any symptoms of UTI, please make sure you give a urine sample to our office or your PCP.   Please call to schedule your CT scan. We will also schedule you for cystoscopy to look inside of your bladder in the office.   Today we talked about ways to manage bladder urgency such as altering your diet to avoid irritative beverages and foods (bladder diet) as well as attempting to decrease stress and other exacerbating factors.    The Most Bothersome Foods* The Least Bothersome Foods*  Coffee - Regular & Decaf Tea - caffeinated Carbonated beverages - cola, non-colas, diet & caffeine-free Alcohols - Beer, Red Wine, White Wine, Champagne Fruits - Grapefruit, [place], Orange, [employer] - Cranberry, Grapefruit, Orange, Pineapple Vegetables - Tomato & Tomato Products Flavor Enhancers - Hot peppers, Spicy foods, Chili, Horseradish, Vinegar, Monosodium glutamate (MSG) Artificial Sweeteners - NutraSweet, Sweet 'N Low, Equal (sweetener), Saccharin Ethnic foods - [place], [place], [place] food Express Scripts - low-fat & whole Fruits - Bananas, Blueberries, Honeydew melon, Pears, Raisins, Watermelon Vegetables - Broccoli, Brussels Sprouts, [place], Carrots, Cauliflower, [place], Cucumber, Mushrooms, Peas, Radishes, Squash, Zucchini, White potatoes, Sweet potatoes & yams Poultry - Chicken, Eggs, [place], [employer] - Beef, Programmer, multimedia, Lamb Seafood - Shrimp, [place] fish, Salmon Grains - Oat, Rice Snacks - Pretzels, Popcorn  *Lissa Morales et al. Diet and its role in interstitial cystitis/bladder pain syndrome (IC/BPS) and comorbid conditions. [place] 2012 Jan 11.    Constipation: Our goal is to achieve formed bowel movements daily or every-other-day.  You may need to try different combinations of the following options to find what works best for  you - everybody's body works differently so feel free to adjust the dosages as needed.  Some options to help maintain bowel health include:  Dietary changes (more leafy greens, vegetables and fruits; less processed foods) Fiber supplementation (Benefiber, FiberCon, Metamucil or Psyllium). Start slow and increase gradually to full dose. Over-the-counter agents such as: stool softeners (Docusate or Colace) and/or laxatives (Miralax, milk of magnesia). Recommend taking miralax once a day for constipation.  "Power Pudding" is a natural mixture that may help your constipation.  To make blend 1 cup applesauce, 1 cup wheat bran, and 3/4 cup prune juice, refrigerate and then take 1 tablespoon daily with a large glass of water as needed.

## 2022-07-08 IMAGING — CT CT ABD-PELV W/ CM
2 of 5 series · 17 of 46 positions shown, 19 images · IV contrast (APPLIED)
Comparison: CT 01/23/2019

CLINICAL DATA: Vomiting diarrhea lower abdominal pain

EXAM:
CT ABDOMEN AND PELVIS WITH CONTRAST
TECHNIQUE: Multidetector CT imaging of the abdomen and pelvis was performed
using the standard protocol following bolus administration of
intravenous contrast.

[Series 2: routine abd/pel with · axial · 0.91mm/px · z∈[-1165,-755]mm · 14 of 92 slices shown, 16 images]
[im 5/92  soft-tissue]
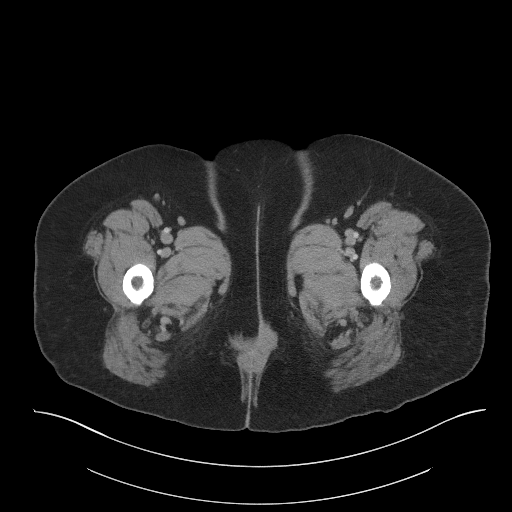
[im 5/92  bone]
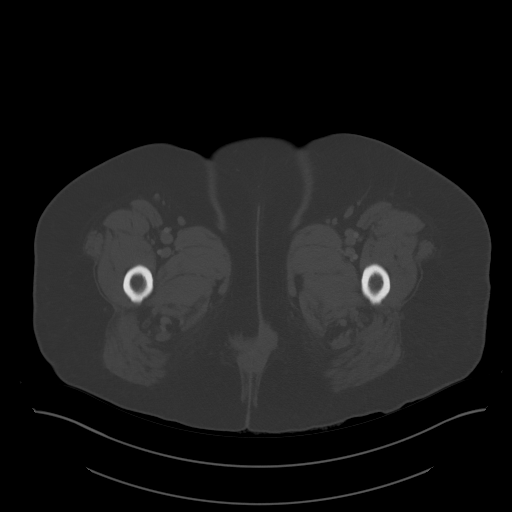
[im 10/92  soft-tissue]
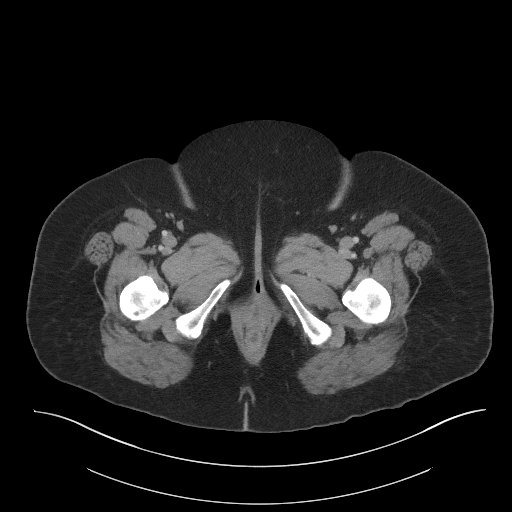
[im 20/92  soft-tissue]
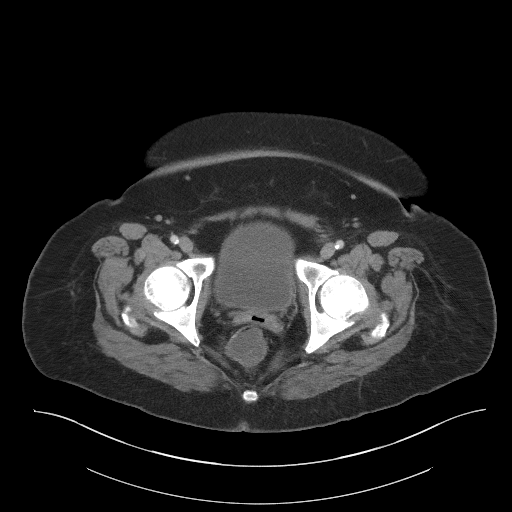
[im 24/92  soft-tissue]
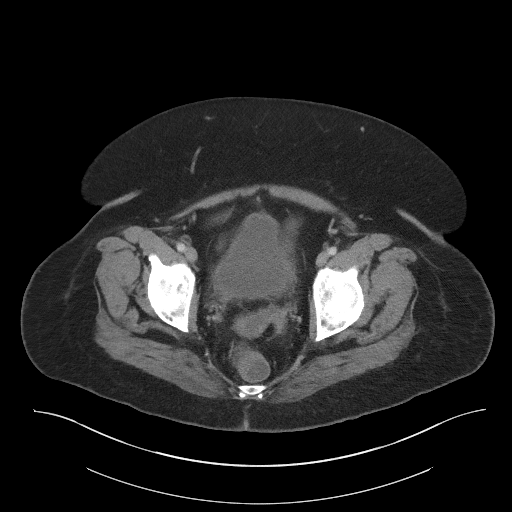
[im 29/92  soft-tissue]
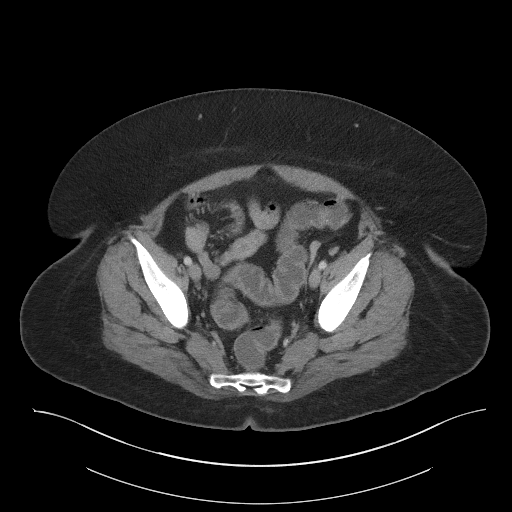
[im 39/92  soft-tissue]
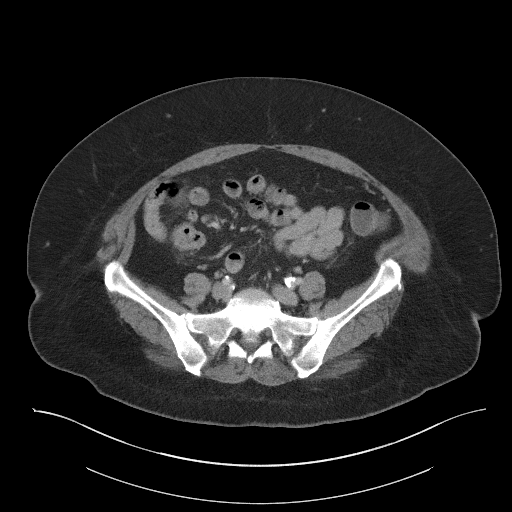
[im 44/92  soft-tissue]
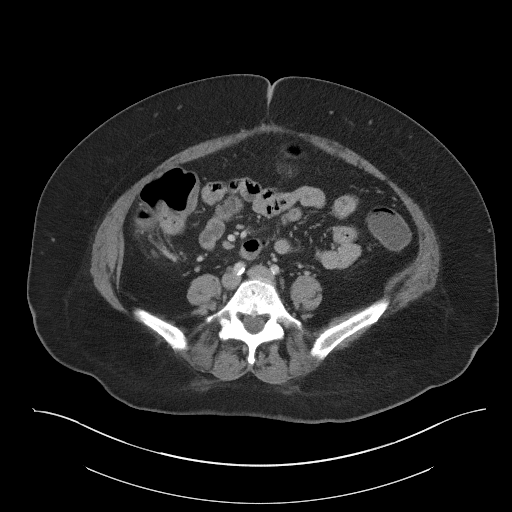
[im 48/92  soft-tissue]
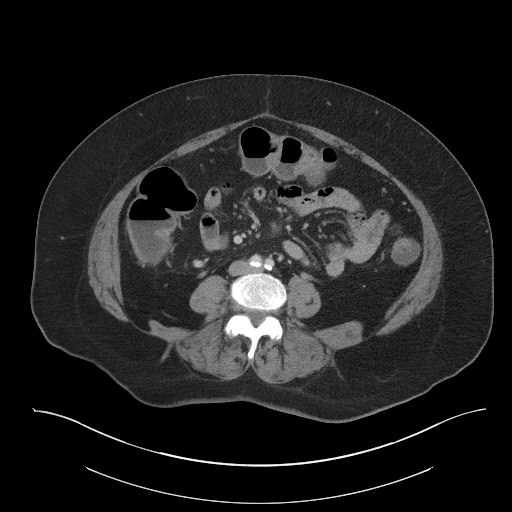
[im 53/92  soft-tissue]
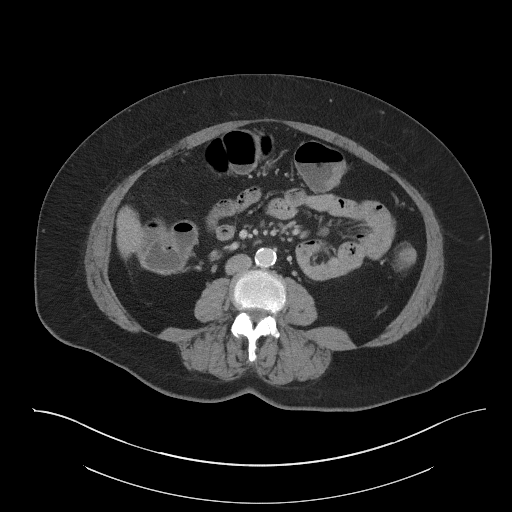
[im 53/92  bone]
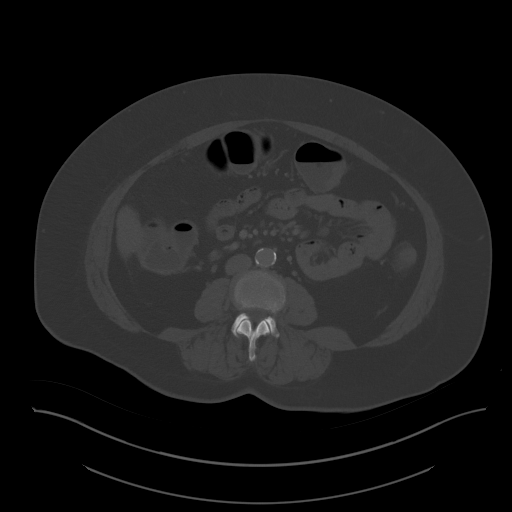
[im 63/92  soft-tissue]
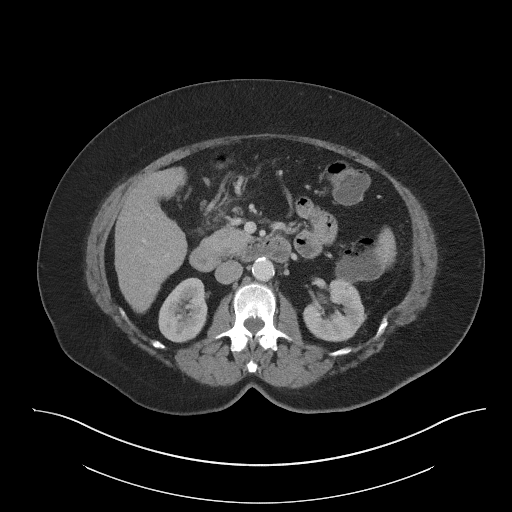
[im 68/92  soft-tissue]
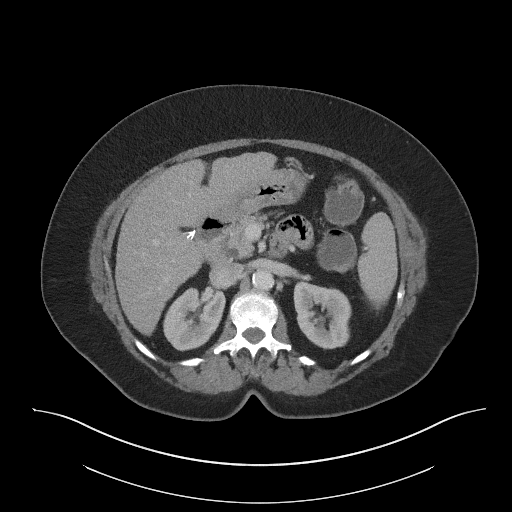
[im 72/92  soft-tissue]
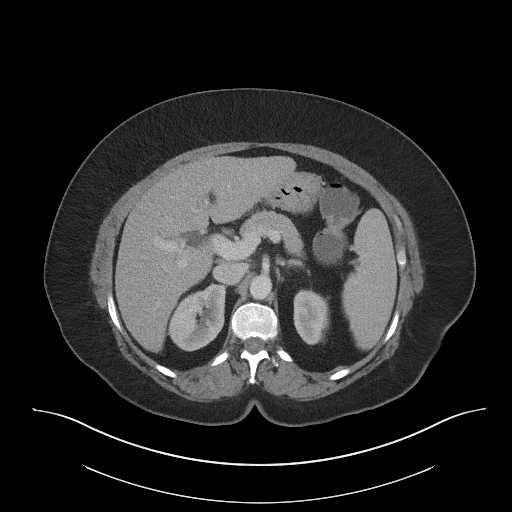
[im 82/92  soft-tissue]
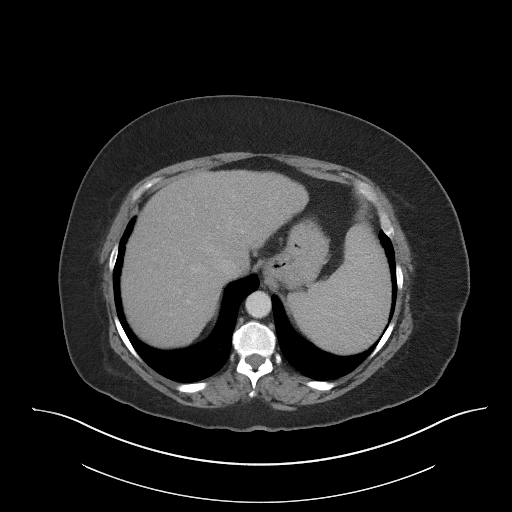
[im 87/92  soft-tissue]
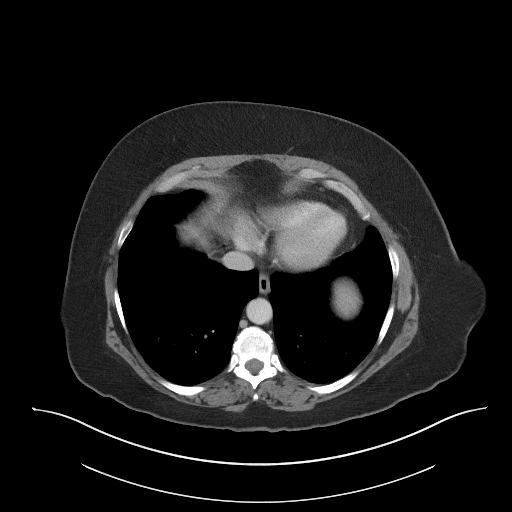

[Series 5: coronal st · coronal · 0.86mm/px · 3 of 111 slices shown]
[im 37/111  soft-tissue]
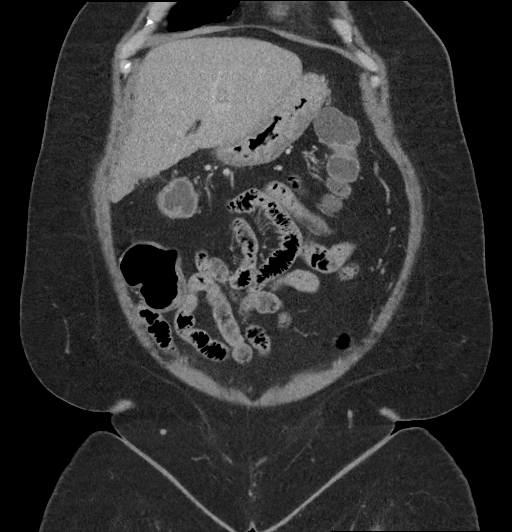
[im 49/111  soft-tissue]
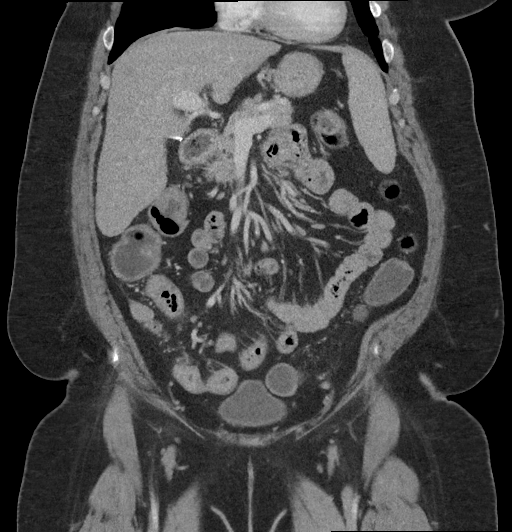
[im 62/111  soft-tissue]
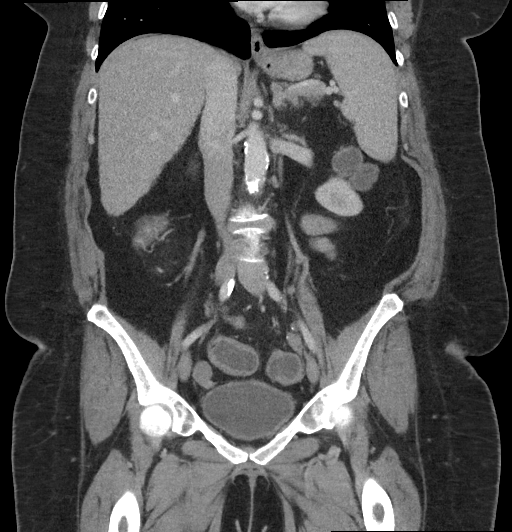

[17 of 46 positions shown; findings below may reference images not displayed]

RADIATION DOSE REDUCTION: This exam was performed according to the
departmental dose-optimization program which includes automated
exposure control, adjustment of the mA and/or kV according to
patient size and/or use of iterative reconstruction technique.

CONTRAST:  100mL OMNIPAQUE IOHEXOL 300 MG/ML  SOLN
FINDINGS: Lower chest: No acute abnormality.

Hepatobiliary: Nodular hepatic contour, suspicious for cirrhosis.
Status post cholecystectomy. Prominent extrahepatic common bile duct
likely due to postsurgical change.

Pancreas: Unremarkable. No pancreatic ductal dilatation or
surrounding inflammatory changes.

Spleen: Normal in size without focal abnormality.

Adrenals/Urinary Tract: Adrenal glands are within normal limits.
Kidneys show no hydronephrosis. Cyst lower pole left kidney, no
follow-up imaging is recommended. The bladder is unremarkable

Stomach/Bowel: The stomach is nonenlarged. No dilated small bowel.
Fluid-filled colon with mild diffuse wall thickening, suspicious for
a colitis. Appendix within normal limits

Vascular/Lymphatic: Moderate aortic atherosclerosis. No aneurysm. No
suspicious lymph nodes

Reproductive: Status post hysterectomy. No adnexal masses.

Other: Negative for pelvic effusion or free air

Musculoskeletal: No acute osseous abnormality
IMPRESSION: 1. Diffuse fluid in the colon consistent with history of diarrhea.
Mild diffuse wall thickening is suspicious for pancolitis of
infectious or inflammatory etiology.
2. Liver cirrhosis

## 2022-07-17 ENCOUNTER — Other Ambulatory Visit: Payer: Self-pay | Admitting: Gastroenterology

## 2022-07-17 DIAGNOSIS — R932 Abnormal findings on diagnostic imaging of liver and biliary tract: Secondary | ICD-10-CM

## 2022-07-19 ENCOUNTER — Ambulatory Visit
Admission: RE | Admit: 2022-07-19 | Discharge: 2022-07-19 | Disposition: A | Discharge status: Home / Self Care | Payer: Medicaid Other | Source: Ambulatory Visit | Attending: Obstetrics and Gynecology | Admitting: Obstetrics and Gynecology

## 2022-07-19 ENCOUNTER — Ambulatory Visit
Admission: RE | Admit: 2022-07-19 | Discharge: 2022-07-19 | Disposition: A | Discharge status: Home / Self Care | Payer: Medicaid Other | Source: Ambulatory Visit | Attending: Gastroenterology | Admitting: Gastroenterology

## 2022-07-19 DIAGNOSIS — Z905 Acquired absence of kidney: Secondary | ICD-10-CM | POA: Diagnosis not present

## 2022-07-19 DIAGNOSIS — R310 Gross hematuria: Secondary | ICD-10-CM | POA: Diagnosis present

## 2022-07-19 DIAGNOSIS — Z85528 Personal history of other malignant neoplasm of kidney: Secondary | ICD-10-CM | POA: Diagnosis not present

## 2022-07-19 DIAGNOSIS — R932 Abnormal findings on diagnostic imaging of liver and biliary tract: Secondary | ICD-10-CM | POA: Insufficient documentation

## 2022-07-19 DIAGNOSIS — I700 Atherosclerosis of aorta: Secondary | ICD-10-CM | POA: Diagnosis not present

## 2022-07-19 MED ORDER — IOHEXOL 300 MG/ML  SOLN
100.0000 mL | Freq: Once | INTRAMUSCULAR | Status: AC | PRN
  Administered 2022-07-19: 100 mL via INTRAVENOUS

## 2022-07-24 ENCOUNTER — Encounter: Payer: Self-pay | Admitting: *Deleted

## 2022-08-10 ENCOUNTER — Other Ambulatory Visit: Payer: Medicaid Other | Admitting: Obstetrics and Gynecology

## 2022-08-24 ENCOUNTER — Other Ambulatory Visit: Payer: Medicaid Other | Admitting: Obstetrics and Gynecology

## 2022-09-28 ENCOUNTER — Encounter: Payer: Self-pay | Admitting: Obstetrics and Gynecology

## 2022-09-28 ENCOUNTER — Ambulatory Visit (INDEPENDENT_AMBULATORY_CARE_PROVIDER_SITE_OTHER): Payer: Medicaid Other | Admitting: Obstetrics and Gynecology

## 2022-09-28 VITALS — BP 100/58 | HR 90

## 2022-09-28 DIAGNOSIS — R350 Frequency of micturition: Secondary | ICD-10-CM

## 2022-09-28 DIAGNOSIS — R310 Gross hematuria: Secondary | ICD-10-CM

## 2022-09-28 LAB — POCT URINALYSIS DIPSTICK
Bilirubin, UA: NEGATIVE
Blood, UA: NEGATIVE
Glucose, UA: NEGATIVE
Ketones, UA: NEGATIVE
Leukocytes, UA: NEGATIVE
Nitrite, UA: NEGATIVE
Protein, UA: NEGATIVE
Spec Grav, UA: >=1.03 — AB (ref 1.010–1.025)
Urobilinogen, UA: 0.2 E.U./dL
pH, UA: 6 (ref 5.0–8.0)

## 2022-09-28 MED ORDER — LIDOCAINE HCL URETHRAL/MUCOSAL 2 % EX GEL
1.0000 | Freq: Once | CUTANEOUS | Status: AC
  Administered 2022-09-28: 1 via URETHRAL

## 2022-09-28 MED ORDER — LIDOCAINE HCL 2 % IJ SOLN: 50.0000 mL | Freq: Once | INTRAMUSCULAR | Status: DC

## 2022-09-28 NOTE — Progress Notes (Signed)
CYSTOSCOPY  CC:  This is a 57 y.o. with gross hematuria who presents today for cystoscopy.  POC urine: negative  BP (!) 100/58   Pulse 90   CYSTOSCOPY: A time out was performed.  The periurethral area was prepped and draped in a sterile manner.  2% lidocaine jetpack was inserted at the urethral meatus and the urethra and bladder visualized with 12- and 70-degree scopes.  She had normal urethral coaptation and normal urethral mucosa.  She had normal bladder mucosa. She had bilateral clear efflux from both ureteral orifices.  She had no squamous metaplasia at the trigone, no trabeculations, cellules or diverticuli.    Also had CT A/P w/ contrast on 07/19/22:  IMPRESSION: 1. Prior partial left nephrectomy without evidence of local recurrence. 2. No hydronephrosis. No renal, ureteral or bladder calculi. 3. Symmetric wall thickening of an under distended urinary bladder evaluation of which is limited by underdistention and urine contamination. Correlate with urinalysis to exclude cystitis. 4. Contour nodularity of the liver suggestive of cirrhosis. 5. Moderate volume of formed stool throughout the colon. Correlate for constipation. 6.  Aortic Atherosclerosis (ICD10-I70.0).  ASSESSMENT:  57 y.o. with gross hematuria and history of renal cell carcinoma with partial left nephrectomy. Cystoscopy today is normal.  PLAN:  - Normal CT A/P without evidence of disease recurrence.  - recommend yearly UA to look for hematuria- can be done with our office or PCP - Follow up as needed  Marguerita Beards, MD

## 2022-09-28 NOTE — Patient Instructions (Signed)
Taking Care of Yourself after Urodynamics, Cystoscopy, Bulkamid Injection, or Botox Injection   Drink plenty of water for a day or two following your procedure. Try to have about 8 ounces (one cup) at a time, and do this 6 times or more per day unless you have fluid restrictitons AVOID irritative beverages such as coffee, tea, soda, alcoholic or citrus drinks for a day or two, as this may cause burning with urination.  For the first 1-2 days after the procedure, your urine may be pink or red in color. You may have some blood in your urine as a normal side effect of the procedure. Large amounts of bleeding or difficulty urinating are NOT normal. Call the nurse line if this happens or go to the nearest Emergency Room if the bleeding is heavy or you cannot urinate at all and it is after hours.  You may experience some discomfort or a burning sensation with urination after having this procedure. You can use over the counter Azo or pyridium to help with burning and follow the instructions on the packaging. If it does not improve within 1-2 days, or other symptoms appear (fever, chills, or difficulty urinating) call the office to speak to a nurse.  You may return to normal daily activities such as work, school, driving, exercising and housework on the day of the procedure. If your doctor gave you a prescription, take it as ordered.     

## 2022-10-12 ENCOUNTER — Encounter: Payer: Self-pay | Admitting: *Deleted

## 2022-10-13 ENCOUNTER — Encounter: Payer: Self-pay | Admitting: *Deleted

## 2022-10-13 ENCOUNTER — Ambulatory Visit: Payer: Medicare Other | Admitting: Anesthesiology

## 2022-10-13 ENCOUNTER — Ambulatory Visit
Admission: RE | Admit: 2022-10-13 | Discharge: 2022-10-13 | Disposition: A | Discharge status: Home / Self Care | Payer: Medicare Other | Source: Ambulatory Visit | Attending: Gastroenterology | Admitting: Gastroenterology

## 2022-10-13 ENCOUNTER — Encounter
Admission: RE | Disposition: A | Discharge status: Home / Self Care | Payer: Self-pay | Source: Ambulatory Visit | Attending: Gastroenterology

## 2022-10-13 DIAGNOSIS — E119 Type 2 diabetes mellitus without complications: Secondary | ICD-10-CM | POA: Diagnosis not present

## 2022-10-13 DIAGNOSIS — Z83719 Family history of colon polyps, unspecified: Secondary | ICD-10-CM | POA: Insufficient documentation

## 2022-10-13 DIAGNOSIS — Z7985 Long-term (current) use of injectable non-insulin antidiabetic drugs: Secondary | ICD-10-CM | POA: Diagnosis not present

## 2022-10-13 DIAGNOSIS — K640 First degree hemorrhoids: Secondary | ICD-10-CM | POA: Diagnosis not present

## 2022-10-13 DIAGNOSIS — Z6841 Body Mass Index (BMI) 40.0 and over, adult: Secondary | ICD-10-CM | POA: Insufficient documentation

## 2022-10-13 DIAGNOSIS — Z1211 Encounter for screening for malignant neoplasm of colon: Secondary | ICD-10-CM | POA: Diagnosis not present

## 2022-10-13 DIAGNOSIS — Z800 Family history of malignant neoplasm of digestive organs: Secondary | ICD-10-CM | POA: Diagnosis not present

## 2022-10-13 DIAGNOSIS — J449 Chronic obstructive pulmonary disease, unspecified: Secondary | ICD-10-CM | POA: Insufficient documentation

## 2022-10-13 DIAGNOSIS — G4730 Sleep apnea, unspecified: Secondary | ICD-10-CM | POA: Insufficient documentation

## 2022-10-13 DIAGNOSIS — I10 Essential (primary) hypertension: Secondary | ICD-10-CM | POA: Insufficient documentation

## 2022-10-13 DIAGNOSIS — R1310 Dysphagia, unspecified: Secondary | ICD-10-CM | POA: Diagnosis not present

## 2022-10-13 DIAGNOSIS — F17200 Nicotine dependence, unspecified, uncomplicated: Secondary | ICD-10-CM | POA: Insufficient documentation

## 2022-10-13 DIAGNOSIS — E669 Obesity, unspecified: Secondary | ICD-10-CM | POA: Diagnosis not present

## 2022-10-13 DIAGNOSIS — K219 Gastro-esophageal reflux disease without esophagitis: Secondary | ICD-10-CM | POA: Insufficient documentation

## 2022-10-13 DIAGNOSIS — Z86010 Personal history of colonic polyps: Secondary | ICD-10-CM | POA: Diagnosis present

## 2022-10-13 HISTORY — PX: ESOPHAGOGASTRODUODENOSCOPY (EGD) WITH PROPOFOL: SHX5813

## 2022-10-13 HISTORY — PX: COLONOSCOPY WITH PROPOFOL: SHX5780

## 2022-10-13 LAB — GLUCOSE, CAPILLARY: Glucose-Capillary: 114 mg/dL — ABNORMAL HIGH (ref 70–99)

## 2022-10-13 SURGERY — ESOPHAGOGASTRODUODENOSCOPY (EGD) WITH PROPOFOL
Anesthesia: General

## 2022-10-13 MED ORDER — PHENYLEPHRINE HCL (PRESSORS) 10 MG/ML IV SOLN
INTRAVENOUS | Status: DC | PRN
  Administered 2022-10-13: 160 ug via INTRAVENOUS
  Administered 2022-10-13: 80 ug via INTRAVENOUS
  Administered 2022-10-13: 160 ug via INTRAVENOUS

## 2022-10-13 MED ORDER — LIDOCAINE HCL (CARDIAC) PF 100 MG/5ML IV SOSY
PREFILLED_SYRINGE | INTRAVENOUS | Status: DC | PRN
  Administered 2022-10-13: 4 mg via INTRAVENOUS
  Administered 2022-10-13: 40 mg via INTRAVENOUS

## 2022-10-13 MED ORDER — PROPOFOL 10 MG/ML IV BOLUS
INTRAVENOUS | Status: DC | PRN
  Administered 2022-10-13: 90 mg via INTRAVENOUS

## 2022-10-13 MED ORDER — SODIUM CHLORIDE 0.9 % IV SOLN: INTRAVENOUS | Status: DC

## 2022-10-13 MED ORDER — PROPOFOL 500 MG/50ML IV EMUL
INTRAVENOUS | Status: DC | PRN
  Administered 2022-10-13: 150 ug/kg/min via INTRAVENOUS

## 2022-10-13 MED ORDER — DEXMEDETOMIDINE HCL IN NACL 200 MCG/50ML IV SOLN
INTRAVENOUS | Status: DC | PRN
  Administered 2022-10-13: 12 ug via INTRAVENOUS

## 2022-10-13 NOTE — Op Note (Signed)
[facility] Gastroenterology Patient Name: Martha Hill Procedure Date: 10/13/2022 7:11 AM MRN: [id number] Account #: [id number] Date of Birth: 1966/04/29 Admit Type: Outpatient Age: 57 Room: [facility] ENDO ROOM 3 Gender: Female Note Status: Finalized Instrument Name: Prentice Docker [id number] Procedure:             Colonoscopy Indications:           Surveillance: History of adenomatous polyps,                         inadequate prep on last exam (<41yr) Providers:             Eather Colas MD, MD Referring MD:          Danella Penton, MD (Referring MD) Medicines:             Monitored Anesthesia Care Complications:         No immediate complications. Procedure:             Pre-Anesthesia Assessment:                        - Prior to the procedure, a History and Physical was                         performed, and patient medications and allergies were                         reviewed. The patient is competent. The risks and                         benefits of the procedure and the sedation options and                         risks were discussed with the patient. All questions                         were answered and informed consent was obtained.                         Patient identification and proposed procedure were                         verified by the physician, the nurse, the                         anesthesiologist, the anesthetist and the technician                         in the endoscopy suite. Mental Status Examination:                         alert and oriented. Airway Examination: normal                         oropharyngeal airway and neck mobility. Respiratory                         Examination: clear to auscultation. CV Examination:  normal. Prophylactic Antibiotics: The patient does not                         require prophylactic antibiotics. Prior                         Anticoagulants: The patient has taken no anticoagulant                          or antiplatelet agents. ASA Grade Assessment: III - A                         patient with severe systemic disease. After reviewing                         the risks and benefits, the patient was deemed in                         satisfactory condition to undergo the procedure. The                         anesthesia plan was to use monitored anesthesia care                         (MAC). Immediately prior to administration of                         medications, the patient was re-assessed for adequacy                         to receive sedatives. The heart rate, respiratory                         rate, oxygen saturations, blood pressure, adequacy of                         pulmonary ventilation, and response to care were                         monitored throughout the procedure. The physical                         status of the patient was re-assessed after the                         procedure.                        After obtaining informed consent, the colonoscope was                         passed under direct vision. Throughout the procedure,                         the patient's blood pressure, pulse, and oxygen                         saturations were monitored continuously. The  Colonoscope was introduced through the anus and                         advanced to the the cecum, identified by appendiceal                         orifice and ileocecal valve. The colonoscopy was                         performed without difficulty. The patient tolerated                         the procedure well. The quality of the bowel                         preparation was adequate to identify polyps. The                         ileocecal valve, appendiceal orifice, and rectum were                         photographed. Findings:      The perianal and digital rectal examinations were normal.      Internal hemorrhoids were found during retroflexion.  The hemorrhoids       were Grade I (internal hemorrhoids that do not prolapse).      A tattoo was seen in the sigmoid colon. The tattoo site appeared normal.      The exam was otherwise without abnormality on direct and retroflexion       views. Impression:            - Internal hemorrhoids.                        - A tattoo was seen in the sigmoid colon. The tattoo                         site appeared normal.                        - The examination was otherwise normal on direct and                         retroflexion views.                        - No specimens collected. Recommendation:        - Discharge patient to home.                        - Resume previous diet.                        - Continue present medications.                        - Repeat colonoscopy in 5 years for surveillance.                        - Return to referring physician as previously  scheduled. Procedure Code(s):     --- Professional ---                        [phone], Colonoscopy, flexible; diagnostic, including                         collection of specimen(s) by brushing or washing, when                         performed (separate procedure) Diagnosis Code(s):     --- Professional ---                        Z86.010, Personal history of colonic polyps                        K64.0, First degree hemorrhoids CPT copyright 2022 American [employer] Association. All rights reserved. The codes documented in this report are preliminary and upon coder review may  be revised to meet current compliance requirements. Eather Colas MD, MD 10/13/2022 8:54:14 AM Number of Addenda: 0 Note Initiated On: 10/13/2022 7:11 AM Scope Withdrawal Time: 0 hours 6 minutes 13 seconds  Total Procedure Duration: 0 hours 12 minutes 6 seconds  Estimated Blood Loss:  Estimated blood loss: none.      [facility]

## 2022-10-13 NOTE — Transfer of Care (Signed)
Immediate Anesthesia Transfer of Care Note  Patient: Martha Hill  Procedure(s) Performed: Procedure(s): ESOPHAGOGASTRODUODENOSCOPY (EGD) WITH PROPOFOL (N/A) COLONOSCOPY WITH PROPOFOL (N/A)  Patient Location: PACU and Endoscopy Unit  Anesthesia Type:General  Level of Consciousness: sedated  Airway & Oxygen Therapy: Patient Spontanous Breathing and Patient connected to nasal cannula oxygen  Post-op Assessment: Report given to RN and Post -op Vital signs reviewed and stable  Post vital signs: Reviewed and stable  Last Vitals:  Vitals:   10/13/22 0723  BP: 97/66  Pulse: 81  Resp: 16  Temp: 36.4 C  SpO2: 96%    Complications: No apparent anesthesia complications

## 2022-10-13 NOTE — Op Note (Signed)
[facility] Gastroenterology Patient Name: Martha Hill Procedure Date: 10/13/2022 7:11 AM MRN: [id number] Account #: [id number] Date of Birth: 1965/08/13 Admit Type: Outpatient Age: 57 Room: [facility] ENDO ROOM 3 Gender: Female Note Status: Finalized Instrument Name: Patton Salles Endoscope [id number] Procedure:             Upper GI endoscopy Indications:           Dysphagia Providers:             Eather Colas MD, MD Referring MD:          Danella Penton, MD (Referring MD) Medicines:             Monitored Anesthesia Care Complications:         No immediate complications. Estimated blood loss:                         Minimal. Procedure:             Pre-Anesthesia Assessment:                        - Prior to the procedure, a History and Physical was                         performed, and patient medications and allergies were                         reviewed. The patient is competent. The risks and                         benefits of the procedure and the sedation options and                         risks were discussed with the patient. All questions                         were answered and informed consent was obtained.                         Patient identification and proposed procedure were                         verified by the physician, the nurse, the                         anesthesiologist, the anesthetist and the technician                         in the endoscopy suite. Mental Status Examination:                         alert and oriented. Airway Examination: normal                         oropharyngeal airway and neck mobility. Respiratory                         Examination: clear to auscultation. CV Examination:  normal. Prophylactic Antibiotics: The patient does not                         require prophylactic antibiotics. Prior                         Anticoagulants: The patient has taken no anticoagulant                         or  antiplatelet agents. ASA Grade Assessment: III - A                         patient with severe systemic disease. After reviewing                         the risks and benefits, the patient was deemed in                         satisfactory condition to undergo the procedure. The                         anesthesia plan was to use monitored anesthesia care                         (MAC). Immediately prior to administration of                         medications, the patient was re-assessed for adequacy                         to receive sedatives. The heart rate, respiratory                         rate, oxygen saturations, blood pressure, adequacy of                         pulmonary ventilation, and response to care were                         monitored throughout the procedure. The physical                         status of the patient was re-assessed after the                         procedure.                        After obtaining informed consent, the endoscope was                         passed under direct vision. Throughout the procedure,                         the patient's blood pressure, pulse, and oxygen                         saturations were monitored continuously. The Endoscope  was introduced through the mouth, and advanced to the                         second part of duodenum. The upper GI endoscopy was                         accomplished without difficulty. The patient tolerated                         the procedure well. Findings:      No endoscopic abnormality was evident in the esophagus to explain the       patient's complaint of dysphagia. Biopsies were obtained from the       proximal and distal esophagus with cold forceps for histology of       suspected eosinophilic esophagitis. Estimated blood loss was minimal.      The entire examined stomach was normal.      The examined duodenum was normal. Impression:            - No endoscopic  esophageal abnormality to explain                         patient's dysphagia.                        - Normal stomach.                        - Normal examined duodenum.                        - Biopsies were taken with a cold forceps for                         evaluation of eosinophilic esophagitis. Recommendation:        - Discharge patient to home.                        - Resume previous diet.                        - Continue present medications.                        - Await pathology results.                        - Return to referring physician as previously                         scheduled. Procedure Code(s):     --- Professional ---                        [phone], Esophagogastroduodenoscopy, flexible,                         transoral; with biopsy, single or multiple Diagnosis Code(s):     --- Professional ---                        R13.10, Dysphagia, unspecified CPT copyright 2022 American Medical Association. All rights reserved. The  codes documented in this report are preliminary and upon coder review may  be revised to meet current compliance requirements. Eather Colas MD, MD 10/13/2022 8:42:06 AM Number of Addenda: 0 Note Initiated On: 10/13/2022 7:11 AM Estimated Blood Loss:  Estimated blood loss was minimal.      [facility]

## 2022-10-13 NOTE — H&P (Signed)
Outpatient short stay form Pre-procedure 10/13/2022  Regis Bill, MD  Primary Physician: Danella Penton, MD  Reason for visit:  Dysphagia/Personal history of polyps  History of present illness:    57 y/o lady with history of obesity, hypertension, and HLD here for EGD for dysphagia and colonoscopy for history of polyps with inadequate prep. Brother with numerous polyps. History of cholecystectomy and hysterectomy. No blood thinners.    Current Facility-Administered Medications:    0.9 %  sodium chloride infusion, , Intravenous, Continuous, Mionna Advincula, Rossie Muskrat, MD, Last Rate: 20 mL/hr at 10/13/22 0746, New Bag at 10/13/22 0746  Medications Prior to Admission  Medication Sig Dispense Refill Last Dose   aspirin EC 81 MG tablet Take 81 mg by mouth daily. Swallow whole.   10/12/2022   Cholecalciferol 50 MCG (2000 UT) CAPS Take 2,000 Units by mouth daily at 6 (six) AM.   Past Week   Fluticasone-Salmeterol (ADVAIR) 250-50 MCG/DOSE AEPB Inhale 1 puff into the lungs 2 (two) times daily.   10/12/2022   Magnesium 250 MG TABS Take 250 mg by mouth daily at 6 (six) AM.   Past Week   albuterol (PROVENTIL HFA;VENTOLIN HFA) 108 (90 Base) MCG/ACT inhaler Inhale into the lungs every 6 (six) hours as needed for wheezing or shortness of breath.      ciprofloxacin (CIPRO) 500 MG tablet Take medication prior to the office cystoscopy procedure. 1 tablet 0    Dulaglutide (TRULICITY) 0.75 MG/0.5ML SOPN Inject 0.5 mLs into the skin once a week. On Sundays   10/01/2022   estradiol (ESTRACE) 0.1 MG/GM vaginal cream Place 0.5g nightly for two weeks then twice a week after 30 g 11    furosemide (LASIX) 20 MG tablet Hold until followup with PCP due to low blood pressure and diarrhea. 30 tablet     lisinopril (ZESTRIL) 20 MG tablet Hold until followup with PCP due to low blood pressure and diarrhea.      omeprazole (PRILOSEC) 20 MG capsule Take 1 capsule (20 mg total) by mouth 2 (two) times daily.      potassium  chloride (KLOR-CON) 10 MEQ tablet Take 3 tablets (30 mEq total) by mouth daily. Home med.      pramipexole (MIRAPEX) 0.25 MG tablet Take 0.25 mg by mouth at bedtime.        Allergies  Allergen Reactions   Eucalyptus Flavor [Flavoring Agent] Shortness Of Breath   Macrolides And Ketolides Nausea And Vomiting    Azithromycin/erythromycin   Salmon Oil [Nutritional Supplements] Swelling   Sulfa Antibiotics Other (See Comments)    Contact dermatitis due to intravaginal preparation   Eucalyptus Oil    Glipizide Nausea And Vomiting   Metformin And Related Diarrhea   Tramadol Hcl    Zithromax [Azithromycin]      Past Medical History:  Diagnosis Date   Arthritis    DDD (degenerative disc disease), lumbar    Diabetes mellitus without complication (HCC)    Family history of adverse reaction to anesthesia    children have nausea with anesthesia   Family history of breast cancer    Family history of colon cancer    Family history of colonic polyps    Family history of lung cancer    GERD (gastroesophageal reflux disease)    Gout    Hamartomatous polyp of large intestine (HCC)    History of kidney cancer    HLD (hyperlipidemia)    Hypertension    Personal history of colonic polyps  PONV (postoperative nausea and vomiting)    Renal cancer (HCC) 2008   RLS (restless legs syndrome)    Sleep apnea     Review of systems:  Otherwise negative.    Physical Exam  Gen: Alert, oriented. Appears stated age.  HEENT: PERRLA. Lungs: No respiratory distress CV: RRR Abd: soft, benign, no masses Ext: No edema    Planned procedures: Proceed with EGD/colonoscopy. The patient understands the nature of the planned procedure, indications, risks, alternatives and potential complications including but not limited to bleeding, infection, perforation, damage to internal organs and possible oversedation/side effects from anesthesia. The patient agrees and gives consent to proceed.  Please refer to  procedure notes for findings, recommendations and patient disposition/instructions.     Regis Bill, MD [facility] Gastroenterology

## 2022-10-13 NOTE — Anesthesia Postprocedure Evaluation (Signed)
Anesthesia Post Note  Patient: Martha Hill  Procedure(s) Performed: ESOPHAGOGASTRODUODENOSCOPY (EGD) WITH PROPOFOL COLONOSCOPY WITH PROPOFOL  Patient location during evaluation: Endoscopy Anesthesia Type: General Level of consciousness: awake and alert Pain management: pain level controlled Vital Signs Assessment: post-procedure vital signs reviewed and stable Respiratory status: spontaneous breathing, nonlabored ventilation, respiratory function stable and patient connected to nasal cannula oxygen Cardiovascular status: blood pressure returned to baseline and stable Postop Assessment: no apparent nausea or vomiting Anesthetic complications: no   No notable events documented.   Last Vitals:  Vitals:   10/13/22 0843 10/13/22 0853  BP: (!) 76/60 (!) 89/61  Pulse:    Resp:    Temp:    SpO2:      Last Pain:  Vitals:   10/13/22 0853  TempSrc:   PainSc: 0-No pain                 Cleda Mccreedy Torii Royse

## 2022-10-13 NOTE — Anesthesia Procedure Notes (Signed)
Date/Time: 10/13/2022 8:05 AM  Performed by: Stormy Fabian, CRNAPre-anesthesia Checklist: Patient identified, Emergency Drugs available, Suction available and Patient being monitored Patient Re-evaluated:Patient Re-evaluated prior to induction Oxygen Delivery Method: Nasal cannula Induction Type: IV induction Dental Injury: Teeth and Oropharynx as per pre-operative assessment  Comments: Nasal cannula with etCO2 monitoring

## 2022-10-13 NOTE — Anesthesia Preprocedure Evaluation (Signed)
Anesthesia Evaluation  Patient identified by MRN, date of birth, ID band Patient awake    Reviewed: Allergy & Precautions, NPO status , Patient's Chart, lab work & pertinent test results  History of Anesthesia Complications (+) PONV and history of anesthetic complications  Airway Mallampati: III  TM Distance: <3 FB Neck ROM: full    Dental  (+) Missing, Poor Dentition   Pulmonary sleep apnea , COPD, Current Smoker   Pulmonary exam normal        Cardiovascular Exercise Tolerance: Good hypertension, (-) angina Normal cardiovascular exam     Neuro/Psych negative neurological ROS  negative psych ROS   GI/Hepatic negative GI ROS, Neg liver ROS,,,  Endo/Other  diabetes, Type 2    Renal/GU Renal disease  negative genitourinary   Musculoskeletal   Abdominal   Peds  Hematology negative hematology ROS (+)   Anesthesia Other Findings Past Medical History: No date: Arthritis No date: DDD (degenerative disc disease), lumbar No date: Diabetes mellitus without complication (HCC) No date: Family history of adverse reaction to anesthesia     Comment:  children have nausea with anesthesia No date: Family history of breast cancer No date: Family history of colon cancer No date: Family history of colonic polyps No date: Family history of lung cancer No date: GERD (gastroesophageal reflux disease) No date: Gout No date: Hamartomatous polyp of large intestine (HCC) No date: History of kidney cancer No date: HLD (hyperlipidemia) No date: Hypertension No date: Personal history of colonic polyps No date: PONV (postoperative nausea and vomiting) 2008: Renal cancer (HCC) No date: RLS (restless legs syndrome) No date: Sleep apnea  Past Surgical History: No date: ABDOMINAL HYSTERECTOMY No date: bladder tack No date: CHOLECYSTECTOMY 01/15/2018: COLONOSCOPY WITH PROPOFOL; N/A     Comment:  Procedure: COLONOSCOPY WITH PROPOFOL;   Surgeon:               Christena Deem, MD;  Location: [facility] ENDOSCOPY;                Service: Endoscopy;  Laterality: N/A; 02/11/2018: COLONOSCOPY WITH PROPOFOL; N/A     Comment:  Procedure: COLONOSCOPY WITH PROPOFOL;  Surgeon:               Christena Deem, MD;  Location: [facility] ENDOSCOPY;                Service: Endoscopy;  Laterality: N/A; 09/10/2020: COLONOSCOPY WITH PROPOFOL; N/A     Comment:  Procedure: COLONOSCOPY WITH PROPOFOL;  Surgeon:               Regis Bill, MD;  Location: [facility] ENDOSCOPY;                Service: Endoscopy;  Laterality: N/A; No date: mastoid surgery for mastoiditis 2008: ROBOTIC ASSITED PARTIAL NEPHRECTOMY; Left No date: TONSILLECTOMY No date: TUBAL LIGATION  BMI    Body Mass Index: 41.64 kg/m      Reproductive/Obstetrics negative OB ROS                             Anesthesia Physical Anesthesia Plan  ASA: 3  Anesthesia Plan: General   Post-op Pain Management:    Induction: Intravenous  PONV Risk Score and Plan: Propofol infusion and TIVA  Airway Management Planned: Natural Airway and Nasal Cannula  Additional Equipment:   Intra-op Plan:   Post-operative Plan:   Informed Consent: I have reviewed the patients History and Physical,  chart, labs and discussed the procedure including the risks, benefits and alternatives for the proposed anesthesia with the patient or authorized representative who has indicated his/her understanding and acceptance.     Dental Advisory Given  Plan Discussed with: Anesthesiologist, CRNA and Surgeon  Anesthesia Plan Comments: (Patient consented for risks of anesthesia including but not limited to:  - adverse reactions to medications - risk of airway placement if required - damage to eyes, teeth, lips or other oral mucosa - nerve damage due to positioning  - sore throat or hoarseness - Damage to heart, brain, nerves, lungs, other parts of body or loss of  life  Patient voiced understanding.)       Anesthesia Quick Evaluation

## 2022-10-13 NOTE — Interval H&P Note (Signed)
History and Physical Interval Note:  10/13/2022 8:08 AM  Martha Hill  has presented today for surgery, with the diagnosis of hC OF ADENOMATOUS POLYP OF COLON,ESOPHAGEAL DYSPHAGIA.  The various methods of treatment have been discussed with the patient and family. After consideration of risks, benefits and other options for treatment, the patient has consented to  Procedure(s): ESOPHAGOGASTRODUODENOSCOPY (EGD) WITH PROPOFOL (N/A) COLONOSCOPY WITH PROPOFOL (N/A) as a surgical intervention.  The patient's history has been reviewed, patient examined, no change in status, stable for surgery.  I have reviewed the patient's chart and labs.  Questions were answered to the patient's satisfaction.     Regis Bill  Ok to proceed with EGD/Colonoscopy

## 2022-10-16 ENCOUNTER — Encounter: Payer: Self-pay | Admitting: Gastroenterology

## 2022-10-16 LAB — SURGICAL PATHOLOGY

## 2022-12-21 DIAGNOSIS — M5412 Radiculopathy, cervical region: Secondary | ICD-10-CM | POA: Diagnosis not present

## 2023-02-22 DIAGNOSIS — M1712 Unilateral primary osteoarthritis, left knee: Secondary | ICD-10-CM | POA: Diagnosis not present

## 2023-02-22 DIAGNOSIS — M25562 Pain in left knee: Secondary | ICD-10-CM | POA: Diagnosis not present

## 2023-02-28 DIAGNOSIS — E1151 Type 2 diabetes mellitus with diabetic peripheral angiopathy without gangrene: Secondary | ICD-10-CM | POA: Diagnosis not present

## 2023-03-06 DIAGNOSIS — E669 Obesity, unspecified: Secondary | ICD-10-CM | POA: Diagnosis not present

## 2023-03-06 DIAGNOSIS — Z6839 Body mass index (BMI) 39.0-39.9, adult: Secondary | ICD-10-CM | POA: Diagnosis not present

## 2023-03-06 DIAGNOSIS — Z23 Encounter for immunization: Secondary | ICD-10-CM | POA: Diagnosis not present

## 2023-03-06 DIAGNOSIS — Z800 Family history of malignant neoplasm of digestive organs: Secondary | ICD-10-CM | POA: Diagnosis not present

## 2023-03-06 DIAGNOSIS — Z0000 Encounter for general adult medical examination without abnormal findings: Secondary | ICD-10-CM | POA: Diagnosis not present

## 2023-03-06 DIAGNOSIS — E119 Type 2 diabetes mellitus without complications: Secondary | ICD-10-CM | POA: Diagnosis not present

## 2023-03-29 DIAGNOSIS — M542 Cervicalgia: Secondary | ICD-10-CM | POA: Diagnosis not present

## 2023-03-29 DIAGNOSIS — M4802 Spinal stenosis, cervical region: Secondary | ICD-10-CM | POA: Diagnosis not present

## 2023-03-29 DIAGNOSIS — Z5181 Encounter for therapeutic drug level monitoring: Secondary | ICD-10-CM | POA: Diagnosis not present

## 2023-03-29 DIAGNOSIS — M5412 Radiculopathy, cervical region: Secondary | ICD-10-CM | POA: Diagnosis not present

## 2023-03-29 DIAGNOSIS — M5459 Other low back pain: Secondary | ICD-10-CM | POA: Diagnosis not present

## 2023-03-29 DIAGNOSIS — M5416 Radiculopathy, lumbar region: Secondary | ICD-10-CM | POA: Diagnosis not present

## 2023-03-29 DIAGNOSIS — F17200 Nicotine dependence, unspecified, uncomplicated: Secondary | ICD-10-CM | POA: Diagnosis not present

## 2023-03-29 DIAGNOSIS — Z79899 Other long term (current) drug therapy: Secondary | ICD-10-CM | POA: Diagnosis not present

## 2023-04-25 DIAGNOSIS — Z23 Encounter for immunization: Secondary | ICD-10-CM | POA: Diagnosis not present

## 2023-04-25 DIAGNOSIS — F17209 Nicotine dependence, unspecified, with unspecified nicotine-induced disorders: Secondary | ICD-10-CM | POA: Diagnosis not present

## 2023-04-25 DIAGNOSIS — I10 Essential (primary) hypertension: Secondary | ICD-10-CM | POA: Diagnosis not present

## 2023-04-25 DIAGNOSIS — E1151 Type 2 diabetes mellitus with diabetic peripheral angiopathy without gangrene: Secondary | ICD-10-CM | POA: Diagnosis not present

## 2023-05-01 DIAGNOSIS — R300 Dysuria: Secondary | ICD-10-CM | POA: Diagnosis not present

## 2023-05-21 DIAGNOSIS — J449 Chronic obstructive pulmonary disease, unspecified: Secondary | ICD-10-CM | POA: Diagnosis not present

## 2023-05-21 DIAGNOSIS — M542 Cervicalgia: Secondary | ICD-10-CM | POA: Diagnosis not present

## 2023-07-30 DIAGNOSIS — M5412 Radiculopathy, cervical region: Secondary | ICD-10-CM | POA: Diagnosis not present

## 2023-07-30 DIAGNOSIS — F17200 Nicotine dependence, unspecified, uncomplicated: Secondary | ICD-10-CM | POA: Diagnosis not present

## 2023-07-30 DIAGNOSIS — M79641 Pain in right hand: Secondary | ICD-10-CM | POA: Diagnosis not present

## 2023-07-30 DIAGNOSIS — M542 Cervicalgia: Secondary | ICD-10-CM | POA: Diagnosis not present

## 2023-07-30 DIAGNOSIS — M5459 Other low back pain: Secondary | ICD-10-CM | POA: Diagnosis not present

## 2023-07-30 DIAGNOSIS — M5416 Radiculopathy, lumbar region: Secondary | ICD-10-CM | POA: Diagnosis not present

## 2023-07-30 DIAGNOSIS — M4802 Spinal stenosis, cervical region: Secondary | ICD-10-CM | POA: Diagnosis not present

## 2023-08-18 DIAGNOSIS — M5412 Radiculopathy, cervical region: Secondary | ICD-10-CM | POA: Diagnosis not present

## 2023-08-20 DIAGNOSIS — M79641 Pain in right hand: Secondary | ICD-10-CM | POA: Diagnosis not present

## 2023-08-20 DIAGNOSIS — E119 Type 2 diabetes mellitus without complications: Secondary | ICD-10-CM | POA: Diagnosis not present

## 2023-08-20 DIAGNOSIS — F17200 Nicotine dependence, unspecified, uncomplicated: Secondary | ICD-10-CM | POA: Diagnosis not present

## 2023-08-20 DIAGNOSIS — G5601 Carpal tunnel syndrome, right upper limb: Secondary | ICD-10-CM | POA: Diagnosis not present

## 2023-08-29 DIAGNOSIS — E1151 Type 2 diabetes mellitus with diabetic peripheral angiopathy without gangrene: Secondary | ICD-10-CM | POA: Diagnosis not present

## 2023-08-29 DIAGNOSIS — E119 Type 2 diabetes mellitus without complications: Secondary | ICD-10-CM | POA: Diagnosis not present

## 2023-09-05 DIAGNOSIS — F330 Major depressive disorder, recurrent, mild: Secondary | ICD-10-CM | POA: Diagnosis not present

## 2023-09-05 DIAGNOSIS — M79672 Pain in left foot: Secondary | ICD-10-CM | POA: Diagnosis not present

## 2023-09-05 DIAGNOSIS — M79671 Pain in right foot: Secondary | ICD-10-CM | POA: Diagnosis not present

## 2023-09-05 DIAGNOSIS — E1151 Type 2 diabetes mellitus with diabetic peripheral angiopathy without gangrene: Secondary | ICD-10-CM | POA: Diagnosis not present

## 2023-09-05 DIAGNOSIS — R0609 Other forms of dyspnea: Secondary | ICD-10-CM | POA: Diagnosis not present

## 2023-09-05 DIAGNOSIS — G4733 Obstructive sleep apnea (adult) (pediatric): Secondary | ICD-10-CM | POA: Diagnosis not present

## 2023-09-05 DIAGNOSIS — M5416 Radiculopathy, lumbar region: Secondary | ICD-10-CM | POA: Diagnosis not present

## 2023-09-10 DIAGNOSIS — M1712 Unilateral primary osteoarthritis, left knee: Secondary | ICD-10-CM | POA: Diagnosis not present

## 2023-09-19 DIAGNOSIS — M542 Cervicalgia: Secondary | ICD-10-CM | POA: Diagnosis not present

## 2023-09-28 DIAGNOSIS — R234 Changes in skin texture: Secondary | ICD-10-CM | POA: Diagnosis not present

## 2023-09-28 DIAGNOSIS — Z87891 Personal history of nicotine dependence: Secondary | ICD-10-CM | POA: Diagnosis not present

## 2023-09-28 DIAGNOSIS — E119 Type 2 diabetes mellitus without complications: Secondary | ICD-10-CM | POA: Diagnosis not present

## 2023-09-28 DIAGNOSIS — M79672 Pain in left foot: Secondary | ICD-10-CM | POA: Diagnosis not present

## 2023-09-28 DIAGNOSIS — M79671 Pain in right foot: Secondary | ICD-10-CM | POA: Diagnosis not present

## 2023-10-17 DIAGNOSIS — S40861A Insect bite (nonvenomous) of right upper arm, initial encounter: Secondary | ICD-10-CM | POA: Diagnosis not present

## 2023-10-17 DIAGNOSIS — W57XXXA Bitten or stung by nonvenomous insect and other nonvenomous arthropods, initial encounter: Secondary | ICD-10-CM | POA: Diagnosis not present

## 2023-11-09 DIAGNOSIS — M25562 Pain in left knee: Secondary | ICD-10-CM | POA: Diagnosis not present

## 2023-11-13 DIAGNOSIS — G4733 Obstructive sleep apnea (adult) (pediatric): Secondary | ICD-10-CM | POA: Diagnosis not present

## 2023-11-13 DIAGNOSIS — E1151 Type 2 diabetes mellitus with diabetic peripheral angiopathy without gangrene: Secondary | ICD-10-CM | POA: Diagnosis not present

## 2023-11-13 DIAGNOSIS — F17209 Nicotine dependence, unspecified, with unspecified nicotine-induced disorders: Secondary | ICD-10-CM | POA: Diagnosis not present

## 2023-11-13 DIAGNOSIS — R300 Dysuria: Secondary | ICD-10-CM | POA: Diagnosis not present

## 2023-11-13 DIAGNOSIS — I10 Essential (primary) hypertension: Secondary | ICD-10-CM | POA: Diagnosis not present

## 2023-11-14 DIAGNOSIS — W57XXXA Bitten or stung by nonvenomous insect and other nonvenomous arthropods, initial encounter: Secondary | ICD-10-CM | POA: Diagnosis not present

## 2023-11-14 DIAGNOSIS — N390 Urinary tract infection, site not specified: Secondary | ICD-10-CM | POA: Diagnosis not present

## 2023-11-14 DIAGNOSIS — S70362A Insect bite (nonvenomous), left thigh, initial encounter: Secondary | ICD-10-CM | POA: Diagnosis not present

## 2023-11-14 DIAGNOSIS — J309 Allergic rhinitis, unspecified: Secondary | ICD-10-CM | POA: Diagnosis not present

## 2023-11-14 DIAGNOSIS — E119 Type 2 diabetes mellitus without complications: Secondary | ICD-10-CM | POA: Diagnosis not present

## 2023-11-16 DIAGNOSIS — M25562 Pain in left knee: Secondary | ICD-10-CM | POA: Diagnosis not present

## 2023-12-05 DIAGNOSIS — F339 Major depressive disorder, recurrent, unspecified: Secondary | ICD-10-CM | POA: Diagnosis not present

## 2023-12-05 DIAGNOSIS — Z01810 Encounter for preprocedural cardiovascular examination: Secondary | ICD-10-CM | POA: Diagnosis not present

## 2023-12-05 DIAGNOSIS — E1151 Type 2 diabetes mellitus with diabetic peripheral angiopathy without gangrene: Secondary | ICD-10-CM | POA: Diagnosis not present

## 2023-12-05 DIAGNOSIS — N3090 Cystitis, unspecified without hematuria: Secondary | ICD-10-CM | POA: Diagnosis not present

## 2023-12-19 DIAGNOSIS — M5412 Radiculopathy, cervical region: Secondary | ICD-10-CM | POA: Diagnosis not present

## 2023-12-19 DIAGNOSIS — M4802 Spinal stenosis, cervical region: Secondary | ICD-10-CM | POA: Diagnosis not present

## 2023-12-19 DIAGNOSIS — M25562 Pain in left knee: Secondary | ICD-10-CM | POA: Diagnosis not present

## 2023-12-19 DIAGNOSIS — M5416 Radiculopathy, lumbar region: Secondary | ICD-10-CM | POA: Diagnosis not present

## 2024-01-07 DIAGNOSIS — M94262 Chondromalacia, left knee: Secondary | ICD-10-CM | POA: Diagnosis not present

## 2024-01-07 DIAGNOSIS — S83232A Complex tear of medial meniscus, current injury, left knee, initial encounter: Secondary | ICD-10-CM | POA: Diagnosis not present

## 2024-01-07 DIAGNOSIS — G8918 Other acute postprocedural pain: Secondary | ICD-10-CM | POA: Diagnosis not present

## 2024-01-07 DIAGNOSIS — M1712 Unilateral primary osteoarthritis, left knee: Secondary | ICD-10-CM | POA: Diagnosis not present

## 2024-01-07 DIAGNOSIS — S83282A Other tear of lateral meniscus, current injury, left knee, initial encounter: Secondary | ICD-10-CM | POA: Diagnosis not present

## 2024-02-08 DIAGNOSIS — F339 Major depressive disorder, recurrent, unspecified: Secondary | ICD-10-CM | POA: Diagnosis not present

## 2024-02-08 DIAGNOSIS — L03113 Cellulitis of right upper limb: Secondary | ICD-10-CM | POA: Diagnosis not present

## 2024-02-15 DIAGNOSIS — L03113 Cellulitis of right upper limb: Secondary | ICD-10-CM | POA: Diagnosis not present

## 2024-02-15 DIAGNOSIS — M79641 Pain in right hand: Secondary | ICD-10-CM | POA: Diagnosis not present

## 2024-03-06 DIAGNOSIS — E1151 Type 2 diabetes mellitus with diabetic peripheral angiopathy without gangrene: Secondary | ICD-10-CM | POA: Diagnosis not present

## 2024-03-12 ENCOUNTER — Other Ambulatory Visit: Payer: Self-pay | Admitting: Internal Medicine

## 2024-03-12 DIAGNOSIS — Z79899 Other long term (current) drug therapy: Secondary | ICD-10-CM | POA: Diagnosis not present

## 2024-03-12 DIAGNOSIS — Z0000 Encounter for general adult medical examination without abnormal findings: Secondary | ICD-10-CM | POA: Diagnosis not present

## 2024-03-12 DIAGNOSIS — Z1231 Encounter for screening mammogram for malignant neoplasm of breast: Secondary | ICD-10-CM

## 2024-03-12 DIAGNOSIS — G4710 Hypersomnia, unspecified: Secondary | ICD-10-CM | POA: Diagnosis not present

## 2024-03-12 DIAGNOSIS — E1151 Type 2 diabetes mellitus with diabetic peripheral angiopathy without gangrene: Secondary | ICD-10-CM | POA: Diagnosis not present

## 2024-04-16 DIAGNOSIS — M542 Cervicalgia: Secondary | ICD-10-CM | POA: Diagnosis not present

## 2024-04-16 DIAGNOSIS — Z79899 Other long term (current) drug therapy: Secondary | ICD-10-CM | POA: Diagnosis not present

## 2024-04-16 DIAGNOSIS — M5412 Radiculopathy, cervical region: Secondary | ICD-10-CM | POA: Diagnosis not present

## 2024-04-16 DIAGNOSIS — M5416 Radiculopathy, lumbar region: Secondary | ICD-10-CM | POA: Diagnosis not present

## 2024-04-16 DIAGNOSIS — M4802 Spinal stenosis, cervical region: Secondary | ICD-10-CM | POA: Diagnosis not present

## 2024-04-25 DIAGNOSIS — R300 Dysuria: Secondary | ICD-10-CM | POA: Diagnosis not present

## 2024-05-14 DIAGNOSIS — I10 Essential (primary) hypertension: Secondary | ICD-10-CM | POA: Diagnosis not present

## 2024-05-14 DIAGNOSIS — G4733 Obstructive sleep apnea (adult) (pediatric): Secondary | ICD-10-CM | POA: Diagnosis not present

## 2024-05-14 DIAGNOSIS — F17209 Nicotine dependence, unspecified, with unspecified nicotine-induced disorders: Secondary | ICD-10-CM | POA: Diagnosis not present

## 2024-05-14 DIAGNOSIS — E1151 Type 2 diabetes mellitus with diabetic peripheral angiopathy without gangrene: Secondary | ICD-10-CM | POA: Diagnosis not present
